# Patient Record
Sex: Female | Born: 1960 | Race: White | Hispanic: No | Marital: Married | Smoking: Never smoker
Health system: Southern US, Community
[De-identification: ages and names within clinical notes are randomized; demographics above are authoritative.]

## PROBLEM LIST (undated history)

## (undated) DIAGNOSIS — K219 Gastro-esophageal reflux disease without esophagitis: Secondary | ICD-10-CM

## (undated) DIAGNOSIS — R51 Headache: Secondary | ICD-10-CM

## (undated) HISTORY — PX: APPENDECTOMY: SHX54

## (undated) HISTORY — PX: HERNIA REPAIR: SHX51

## (undated) HISTORY — PX: CHOLECYSTECTOMY: SHX55

---

## 2001-06-16 ENCOUNTER — Other Ambulatory Visit: Admission: RE | Admit: 2001-06-16 | Discharge: 2001-06-16 | Payer: Self-pay | Admitting: Obstetrics and Gynecology

## 2002-10-04 ENCOUNTER — Encounter: Payer: Self-pay | Admitting: Obstetrics and Gynecology

## 2002-10-04 ENCOUNTER — Ambulatory Visit (HOSPITAL_COMMUNITY): Admission: RE | Admit: 2002-10-04 | Discharge: 2002-10-04 | Payer: Self-pay | Admitting: Obstetrics and Gynecology

## 2002-10-28 ENCOUNTER — Other Ambulatory Visit: Admission: RE | Admit: 2002-10-28 | Discharge: 2002-10-28 | Payer: Self-pay | Admitting: Obstetrics and Gynecology

## 2010-10-16 ENCOUNTER — Inpatient Hospital Stay (HOSPITAL_COMMUNITY)
Admission: RE | Admit: 2010-10-16 | Discharge: 2010-10-17 | Disposition: A | Discharge status: Still In Hospital | Payer: Self-pay | Source: Home / Self Care | Attending: Family Medicine | Admitting: Family Medicine

## 2010-10-17 ENCOUNTER — Encounter (INDEPENDENT_AMBULATORY_CARE_PROVIDER_SITE_OTHER): Payer: Self-pay | Admitting: General Surgery

## 2010-10-20 ENCOUNTER — Encounter: Payer: Self-pay | Admitting: Obstetrics and Gynecology

## 2010-10-21 LAB — DIFFERENTIAL
Basophils Absolute: 0 10*3/uL (ref 0.0–0.1)
Basophils Relative: 0 % (ref 0–1)
Eosinophils Absolute: 0 10*3/uL (ref 0.0–0.7)
Eosinophils Relative: 0 % (ref 0–5)
Lymphocytes Relative: 12 % (ref 12–46)
Lymphs Abs: 3 10*3/uL (ref 0.7–4.0)
Monocytes Absolute: 1.5 10*3/uL — ABNORMAL HIGH (ref 0.1–1.0)
Monocytes Relative: 6 % (ref 3–12)
Neutro Abs: 20.5 10*3/uL — ABNORMAL HIGH (ref 1.7–7.7)
Neutrophils Relative %: 82 % — ABNORMAL HIGH (ref 43–77)

## 2010-10-21 LAB — COMPREHENSIVE METABOLIC PANEL
ALT: 14 U/L (ref 0–35)
AST: 14 U/L (ref 0–37)
Albumin: 3.4 g/dL — ABNORMAL LOW (ref 3.5–5.2)
Alkaline Phosphatase: 49 U/L (ref 39–117)
BUN: 10 mg/dL (ref 6–23)
CO2: 22 mEq/L (ref 19–32)
Calcium: 8.9 mg/dL (ref 8.4–10.5)
Chloride: 103 mEq/L (ref 96–112)
Creatinine, Ser: 0.8 mg/dL (ref 0.4–1.2)
GFR calc Af Amer: >60 mL/min (ref >60)
GFR calc non Af Amer: >60 mL/min (ref >60)
Glucose, Bld: 103 mg/dL — ABNORMAL HIGH (ref 70–99)
Potassium: 3.8 mEq/L (ref 3.5–5.1)
Sodium: 136 mEq/L (ref 135–145)
Total Bilirubin: 0.9 mg/dL (ref 0.3–1.2)
Total Protein: 7 g/dL (ref 6.0–8.3)

## 2010-10-21 LAB — CBC
HCT: 36.8 % (ref 36.0–46.0)
Hemoglobin: 12.9 g/dL (ref 12.0–15.0)
MCH: 30.4 pg (ref 26.0–34.0)
MCHC: 35.1 g/dL (ref 30.0–36.0)
MCV: 86.6 fL (ref 78.0–100.0)
Platelets: 282 10*3/uL (ref 150–400)
RBC: 4.25 MIL/uL (ref 3.87–5.11)
RDW: 12.6 % (ref 11.5–15.5)
WBC: 25 10*3/uL — ABNORMAL HIGH (ref 4.0–10.5)

## 2010-10-21 LAB — URINALYSIS, ROUTINE W REFLEX MICROSCOPIC
Bilirubin Urine: NEGATIVE
Nitrite: NEGATIVE
Specific Gravity, Urine: <1.005 — ABNORMAL LOW (ref 1.005–1.030)
Urine Glucose, Fasting: NEGATIVE mg/dL
pH: 5 (ref 5.0–8.0)

## 2010-10-21 LAB — URINE MICROSCOPIC-ADD ON

## 2010-10-21 LAB — LIPASE, BLOOD: Lipase: 23 U/L (ref 11–59)

## 2010-10-24 NOTE — Op Note (Addendum)
Marisa Mitchell, Marisa Mitchell             ACCOUNT NO.:  [id number]  MEDICAL RECORD NO.:  [id number]          PATIENT TYPE:  INP  LOCATION:  A308                          FACILITY:  [facility]  PHYSICIAN:  Tilford Pillar, MD      DATE OF BIRTH:  Sep 10, 1961  DATE OF PROCEDURE:  10/17/2010 DATE OF DISCHARGE:  10/16/2010                              OPERATIVE REPORT   PREOPERATIVE DIAGNOSIS:  Acute appendicitis.  POSTOPERATIVE DIAGNOSIS:  Acute appendicitis.  PROCEDURE:  Laparoscopic appendectomy.  SURGEON:  Tilford Pillar, MD  ANESTHESIA:  General endotracheal, local anesthetic 0.5% Sensorcaine plain.  SPECIMEN:  Appendix.  ESTIMATED BLOOD LOSS:  Minimal.  INDICATIONS:  The patient is a 50 year old female who presented to [facility] after being evaluated by her primary care physician with suspicion of acute appendicitis.  CT evaluation of the abdomen and pelvis was consistent with the findings and surgical consultation was obtained.  Risks, benefits and alternatives of laparoscopic possible open appendectomy were discussed with the patient including, but not limited to the risk of bleeding, infection bile leak, small bowel injury, common bile duct injury, as well as possibility of intraoperative cardiac or pulmonary events.  The patient's questions and concerns were addressed, and the patient was consented for a planned procedure.  OPERATION:  The patient was taken to the operating room, was placed in the supine position on the operating table, at which time the general anesthetic was administered.  Once the patient was asleep, she was endotracheally intubated by Anesthesia.  At this point, her abdomen was prepped in DuraPrep solution and draped in standard fashion.  After a Foley catheter placed in standard sterile fashion by the operative staff.  At this time, a stab incision was created supraumbilically with a #11 blade scalpel.  Additional dissection down through  the subcuticular tissue was carried out using a Kocher clamp, which was utilized to grasp the anterior abdominal fascia and removed this anteriorly.  A Veress needle was inserted.  Saline drop test was utilized to confirm intraperitoneal placement and then pneumoperitoneum was initiated.  Once sufficient pneumoperitoneum was obtained, a 11-mm trocar was inserted over the laparoscope allowing visualization of the trocar entering into the peritoneal cavity.  At this time, the inner cannula was removed.  The laparoscope was reinserted.  There was no evidence of any trocar or Veress needle placement injury.  At this time, the remaining trocars were placed with a 5-mm trocar in the suprapubic region and an 11-mm trocar in the left lateral abdominal wall.  The patient was positioned into a Trendelenburg left lateral decubitus position.  The cecum was identified, followed down the appendix which was clearly evident and present hyperemic and distended.  A window was created at the base of the appendix between the appendix and the mesoappendix.  The GIA-45 stapler was placed through the umbilical trocar site and then the trocar was removed.  I divided the base of the appendix.  Second load was required to completely divide the appendix and appendiceal stump.  I used the EnSeal LigaSure device to divide the mesentery and the mesoappendix.  At this point, the appendix was  freed and was placed into an EndoCatch bag and placed on the right lobe of the liver.  At this time inspection of the staple line and leak test did not see any bleeding, excellent staple line and at the time, I turned my attention to closure.  A EndoClose suture passing device was utilized to pass a 2-0 Vicryl suture to both the 11-mm trocar sites.  With these sutures in place, the appendix was treated and submitted to the umbilical trocar site and intact EndoCatch bag.  Once the patient was turned, we adequately removed the  appendix, once it was passed off to the back table and sent as a permanent specimen to pathology.  At this time, pneumoperitoneum was evacuated.  The trocar was removed.  The local anesthetic was instilled.  A 4-0 Monocryl was utilized to approximate the skin edges of all 3 trocar sites.  The skin was washed and dried with moist and dry towel.  Benzoin was applied around the incision.  A 0.5-inch Steri- Strips were placed.  Drapes removed.  The patient was allowed to come out of general anesthetic and was transferred back to regular hospital in stable condition.  At the conclusion of the procedure, all instrument, sponge, and needle counts were correct.  The patient tolerated the procedure extremely well.     Tilford Pillar, MD     BZ/MEDQ  D:  10/17/2010  T:  10/17/2010  Job:  [id number]  Electronically Signed by Tilford Pillar MD on 10/24/2010 04:58:45 PM

## 2010-10-24 NOTE — H&P (Addendum)
Marisa Mitchell, CHATHAM             ACCOUNT NO.:  [id number]  MEDICAL RECORD NO.:  [id number]          PATIENT TYPE:  AMB  LOCATION:  ED                            FACILITY:  [facility]  PHYSICIAN:  Tilford Pillar, MD      DATE OF BIRTH:  07-Jun-1961  DATE OF ADMISSION:  10/16/2010 DATE OF DISCHARGE:  LH                             HISTORY & PHYSICAL   CHIEF COMPLAINT:  Right lower quadrant abdominal pain.  HISTORY OF PRESENT ILLNESS:  The patient is a 50 year old female who presented to [facility] with approximately 24 hours of right lower quadrant abdominal pain.  Actually, the patient was awakened earlier this morning with periumbilical pain which was then localized to the right lower quadrant as the day progressed.  She has had associated nausea but no emesis.  She has had subjective chills and fevers and low grade temperatures.  No change with bowel movements.  No melena, no hematochezia.  She has had anorexia.  She has had anorexia through the day.  She has had no similar symptomatology in the past.  Pain is consistent.  It is constant.  It is exacerbated with movement and palpation of the right lower quadrant.  PAST MEDICAL HISTORY:  None.  PAST SURGICAL HISTORY:  Cesarean section x2.  MEDICATIONS:  Estrogen.  ALLERGIES:  NO KNOWN DRUG ALLERGIES.  SOCIAL HISTORY:  Tobacco, no alcohol.  No recreational drug abuse.  FAMILY HISTORY:  Noncontributory.  REVIEW OF SYSTEMS:  CONSTITUTIONAL:  Unremarkable.  EYES:  Unremarkable. ENT:  Unremarkable.  RESPIRATORY:  Unremarkable.  CARDIOVASCULAR: Unremarkable.  GASTROINTESTINAL:  As per HPI.  GENITOURINARY: Unremarkable.  MUSCULOSKELETAL:  Unremarkable.  SKIN:  Unremarkable. ENDOCRINE:  Unremarkable.  NEURO:  Unremarkable.  PHYSICAL EXAM:  VITAL SIGNS: Temperature 99.1, heart rate 103, respiration 19, blood pressure 128/70. GENERAL:  The patient is not in any acute distress.  She is alert and oriented x3. HEENT: Scalp:  No  deformities or masses.  Eyes: Pupils equal, round, reactive.  Extraocular is intact.  No conjunctival pallor is noted. Oral mucosa is pink.  Normal occlusion. NECK:  Trachea is midline.  No cervical lymphadenopathy. PULMONARY:  Unlabored respirations.  She is clear to auscultation bilaterally. CARDIOVASCULAR:  She is tachycardic with a regular rhythm. ABDOMEN:  Diminished bowel sounds.  Abdomen is soft, obese.  Positive right lower quadrant abdominal pain at McBurney's point.  No Rovsing's sign.  No diffuse peritoneal signs.  She does have a small palpable umbilical hernia which is easily reducible.  No other hernias or masses are apparent. EXTREMITIES:  Warm and dry.  PERTINENT LABORATORY/RADIOGRAPHIC STUDIES:  CBC:  White blood cell count 25.0, hemoglobin 12.9, hematocrit 36.8, bicarb 282.  Basic metabolic panel:  Sodium 136, potassium 3.8, chloride 103, bicarb 22, BUN 10, creatinine 0.80, blood glucose 103.  CT of the abdomen and pelvis demonstrates no evidence of any free air or free fluid.  There is periappendiceal and pericecal fat stranding. There is some dilatation of the appendix, and a fecalith was noted within the base of the appendix.  ASSESSMENT/PLAN:  Acute appendicitis:  At this time, the patient will be  continued on an n.p.o. status.  Will be continued on IV fluid and IV antibiotics.  Risks, benefits, and alternatives of possible open appendectomy were discussed with the patient and the patient's husband, who is present during the time of evaluation and discussion.  Risks, benefits and alternatives including to but not limited to the risk of bleeding, infection, appendiceal stump leak as well as the possibility of intraoperative cardiopulmonary events were discussed.  At this time we will proceed to the operating room for an emergent appendectomy.     Tilford Pillar, MD     BZ/MEDQ  D:  10/16/2010  T:  10/16/2010  Job:  [id number]  Electronically Signed by Tilford Pillar MD on 10/24/2010 04:57:18 PM

## 2010-11-21 NOTE — Discharge Summary (Signed)
  NAMECLOIS, MONTAVON             ACCOUNT NO.:  [id number]  MEDICAL RECORD NO.:  [id number]          PATIENT TYPE:  INP  LOCATION:  A308                          FACILITY:  [facility]  PHYSICIAN:  Tilford Pillar, MD      DATE OF BIRTH:  10-16-1960  DATE OF ADMISSION:  10/16/2010 DATE OF DISCHARGE:  01/19/2012LH                              DISCHARGE SUMMARY   This is a 24-hour discharge.  ADMISSION DIAGNOSIS:  Right lower quadrant abdominal pain.  DISCHARGE DIAGNOSIS:  Acute appendicitis status post laparoscopic appendectomy.  PROCEDURES:  Laparoscopic appendectomy.  DISPOSITION:  Home.  BRIEF HISTORY AND PHYSICAL:  Please see the admission history and physical for complete H and P.  The patient is a 50 year old black female who presented to [facility] with approximately 24 hours of increasing right lower quadrant abdominal pain.  Workup was consistent for acute appendicitis.  She was admitted for planned management intervention.  HOSPITAL COURSE:  The patient was admitted.  She was taken to the operating room on the same day.  She underwent a laparoscopic appendectomy, tolerated this extremely well.  Spent a brief period in the post-anesthetic care unit and was transferred to the floor.  Due to the timing of this, she was watched overnight.  She was advanced back on her diet the following morning, she tolerated this extremely well and she was ambulatory.  The pain was controlled and she was tolerating a regular diet.  Plans were made for discharge to home.  She was discharged to home.  DISCHARGE INSTRUCTIONS:  She is to increase activities slowly.  She is not to lift anything greater than 20 pounds for the next 4 weeks.  She may shower, but is not to soak the incisions for the next 2-3 weeks. She was instructed to resume a normal diet.  She is to return to see me in the office in 3 weeks.  She is to call should she have any questions or concerns.  DISCHARGE  MEDICATIONS:  Please see the discharge medication reconciliation sheet for all discharge medications.     Tilford Pillar, MD     BZ/MEDQ  D:  11/20/2010  T:  11/21/2010  Job:  [id number]  Electronically Signed by Tilford Pillar MD on 11/21/2010 10:13:25 AM

## 2011-08-30 ENCOUNTER — Emergency Department (HOSPITAL_COMMUNITY): Payer: Commercial Indemnity

## 2011-08-30 ENCOUNTER — Emergency Department (HOSPITAL_COMMUNITY)
Admission: EM | Admit: 2011-08-30 | Discharge: 2011-08-30 | Disposition: A | Discharge status: Home / Self Care | Payer: Commercial Indemnity | Attending: Emergency Medicine | Admitting: Emergency Medicine

## 2011-08-30 DIAGNOSIS — R112 Nausea with vomiting, unspecified: Secondary | ICD-10-CM | POA: Insufficient documentation

## 2011-08-30 DIAGNOSIS — R51 Headache: Secondary | ICD-10-CM

## 2011-08-30 DIAGNOSIS — G44209 Tension-type headache, unspecified, not intractable: Secondary | ICD-10-CM | POA: Insufficient documentation

## 2011-08-30 HISTORY — DX: Headache: R51

## 2011-08-30 LAB — CBC
MCH: 29.6 pg (ref 26.0–34.0)
Platelets: 296 10*3/uL (ref 150–400)
RBC: 4.66 MIL/uL (ref 3.87–5.11)

## 2011-08-30 LAB — DIFFERENTIAL
Basophils Relative: 0 % (ref 0–1)
Eosinophils Absolute: 0 10*3/uL (ref 0.0–0.7)
Lymphs Abs: 1.2 10*3/uL (ref 0.7–4.0)
Neutro Abs: 14.4 10*3/uL — ABNORMAL HIGH (ref 1.7–7.7)
Neutrophils Relative %: 89 % — ABNORMAL HIGH (ref 43–77)

## 2011-08-30 LAB — COMPREHENSIVE METABOLIC PANEL
ALT: 10 U/L (ref 0–35)
Albumin: 3.7 g/dL (ref 3.5–5.2)
Alkaline Phosphatase: 74 U/L (ref 39–117)
Glucose, Bld: 116 mg/dL — ABNORMAL HIGH (ref 70–99)
Potassium: 3.8 mEq/L (ref 3.5–5.1)
Sodium: 139 mEq/L (ref 135–145)
Total Protein: 7.7 g/dL (ref 6.0–8.3)

## 2011-08-30 MED ORDER — METOCLOPRAMIDE HCL 5 MG/ML IJ SOLN
10.0000 mg | Freq: Once | INTRAMUSCULAR | Status: AC
  Administered 2011-08-30: 10 mg via INTRAVENOUS
  Filled 2011-08-30: qty 2

## 2011-08-30 MED ORDER — KETOROLAC TROMETHAMINE 30 MG/ML IJ SOLN
30.0000 mg | Freq: Once | INTRAMUSCULAR | Status: AC
  Administered 2011-08-30: 30 mg via INTRAVENOUS
  Filled 2011-08-30: qty 1

## 2011-08-30 MED ORDER — HYDROCODONE-ACETAMINOPHEN 5-325 MG PO TABS: 1.0000 | ORAL_TABLET | Freq: Four times a day (QID) | ORAL | Status: AC | PRN

## 2011-08-30 NOTE — ED Provider Notes (Signed)
History     CSN: [id number] Arrival date & time: 08/30/2011  3:12 PM   First MD Initiated Contact with Patient 08/30/11 1535      Chief Complaint  Patient presents with  . Headache  . Emesis    (Consider location/radiation/quality/duration/timing/severity/associated sxs/prior treatment) Patient is a 50 y.o. female presenting with headaches and vomiting. The history is provided by the patient (The patient complains of a headache with nausea. She has had this for a few days. Patient states that she has frequent headaches. This was worse than her normal head). No language interpreter was used.  Headache  This is a recurrent problem. The current episode started 2 days ago. The problem occurs constantly. The problem has not changed since onset.The headache is associated with nothing. The pain is located in the bilateral region. The quality of the pain is described as dull. The pain is at a severity of 7/10. Associated symptoms include nausea and vomiting. Pertinent negatives include no anorexia and no fever. She has tried nothing for the symptoms.  Emesis  Associated symptoms include headaches. Pertinent negatives include no abdominal pain, no cough, no diarrhea and no fever.    Past Medical History  Diagnosis Date  . Headache     Past Surgical History  Procedure Date  . Cesarean section   . Appendectomy     No family history on file.  History  Substance Use Topics  . Smoking status: Never Smoker   . Smokeless tobacco: Not on file  . Alcohol Use: No    OB History    Grav Para Term Preterm Abortions TAB SAB Ect Mult Living                  Review of Systems  Constitutional: Negative for fever and fatigue.  HENT: Negative for congestion, sinus pressure and ear discharge.   Eyes: Negative for discharge.  Respiratory: Negative for cough.   Cardiovascular: Negative for chest pain.  Gastrointestinal: Positive for nausea and vomiting. Negative for abdominal pain, diarrhea  and anorexia.  Genitourinary: Negative for frequency and hematuria.  Musculoskeletal: Negative for back pain.  Skin: Negative for rash.  Neurological: Positive for headaches. Negative for seizures.  Hematological: Negative.   Psychiatric/Behavioral: Negative for hallucinations.    Allergies  Codeine  Home Medications   Current Outpatient Rx  Name Route Sig Dispense Refill  . NORETHINDRONE ACET-ETHINYL EST 1.5-30 MG-MCG PO TABS Oral Take 1 tablet by mouth daily.      Marland Kitchen HYDROCODONE-ACETAMINOPHEN 5-325 MG PO TABS Oral Take 1 tablet by mouth every 6 (six) hours as needed for pain. 20 tablet 0    BP 141/82  Pulse 92  Temp(Src) 98.2 F (36.8 C) (Oral)  Resp 20  Ht 5\' 7"  (1.702 m)  Wt 190 lb (86.183 kg)  BMI 29.76 kg/m2  SpO2 99%  Physical Exam  Constitutional: She is oriented to person, place, and time. She appears well-developed.  HENT:  Head: Normocephalic and atraumatic.  Eyes: Conjunctivae and EOM are normal. No scleral icterus.  Neck: Neck supple. No thyromegaly present.  Cardiovascular: Normal rate and regular rhythm.  Exam reveals no gallop and no friction rub.   No murmur heard. Pulmonary/Chest: No stridor. She has no wheezes. She has no rales. She exhibits no tenderness.  Abdominal: She exhibits no distension. There is no tenderness. There is no rebound.  Musculoskeletal: Normal range of motion. She exhibits no edema.  Lymphadenopathy:    She has no cervical adenopathy.  Neurological: She  is oriented to person, place, and time. Coordination normal.  Skin: No rash noted. No erythema.  Psychiatric: She has a normal mood and affect. Her behavior is normal.    ED Course  Procedures (including critical care time)  Labs Reviewed  CBC - Abnormal; Notable for the following:    WBC 16.2 (*)    All other components within normal limits  DIFFERENTIAL - Abnormal; Notable for the following:    Neutrophils Relative 89 (*)    Neutro Abs 14.4 (*)    Lymphocytes Relative 7  (*)    All other components within normal limits  COMPREHENSIVE METABOLIC PANEL - Abnormal; Notable for the following:    Glucose, Bld 116 (*)    All other components within normal limits   Ct Head Wo Contrast  08/30/2011  *RADIOLOGY REPORT*  Clinical Data: Headache and vomiting  CT HEAD WITHOUT CONTRAST  Technique:  Contiguous axial images were obtained from the base of the skull through the vertex without contrast.  Comparison: None.  Findings: The ventricles are normal in size, shape, and position. There is no mass effect or midline shift.  No acute hemorrhage or abnormal extra-axial fluid collections are identified.  The gray/white differentiation is normal.  The orbits, calvarium, visualized paranasal sinuses have a normal appearance.  IMPRESSION: Normal CT scan of the head with no evidence of acute intracranial abnormality.  Original Report Authenticated By: Brandon Melnick, M.D.     1. Headache     Pt improved with tx  MDM  Stress headache        Benny Lennert, MD 08/30/11 [phone]

## 2011-08-30 NOTE — ED Notes (Signed)
Pt presents with headache and vomiting. Pt states she vomited once this afternoon and instantly got a headache. Pt states this is worse than her normal "monthly" headaches. Pt states pain comes in waves.

## 2012-02-10 ENCOUNTER — Other Ambulatory Visit: Payer: Self-pay | Admitting: Obstetrics and Gynecology

## 2012-05-25 ENCOUNTER — Other Ambulatory Visit (HOSPITAL_COMMUNITY): Payer: Self-pay | Admitting: Obstetrics and Gynecology

## 2012-05-25 ENCOUNTER — Encounter (HOSPITAL_COMMUNITY): Payer: Self-pay | Admitting: Pharmacist

## 2012-05-25 ENCOUNTER — Other Ambulatory Visit: Payer: Self-pay | Admitting: Obstetrics and Gynecology

## 2012-05-25 ENCOUNTER — Ambulatory Visit (HOSPITAL_COMMUNITY): Payer: Commercial Indemnity

## 2012-05-25 ENCOUNTER — Ambulatory Visit
Admission: RE | Admit: 2012-05-25 | Discharge: 2012-05-25 | Disposition: A | Discharge status: Home / Self Care | Payer: Commercial Indemnity | Source: Ambulatory Visit | Attending: Obstetrics and Gynecology | Admitting: Obstetrics and Gynecology

## 2012-05-25 ENCOUNTER — Encounter (HOSPITAL_COMMUNITY): Payer: Self-pay | Admitting: *Deleted

## 2012-05-25 DIAGNOSIS — R109 Unspecified abdominal pain: Secondary | ICD-10-CM

## 2012-05-25 DIAGNOSIS — R140 Abdominal distension (gaseous): Secondary | ICD-10-CM

## 2012-05-25 MED ORDER — IOHEXOL 300 MG/ML  SOLN
100.0000 mL | Freq: Once | INTRAMUSCULAR | Status: AC | PRN
  Administered 2012-05-25: 100 mL via INTRAVENOUS

## 2012-05-26 ENCOUNTER — Encounter (INDEPENDENT_AMBULATORY_CARE_PROVIDER_SITE_OTHER): Payer: Self-pay | Admitting: *Deleted

## 2012-05-27 ENCOUNTER — Ambulatory Visit (INDEPENDENT_AMBULATORY_CARE_PROVIDER_SITE_OTHER): Payer: Commercial Indemnity | Admitting: Internal Medicine

## 2012-05-27 ENCOUNTER — Encounter (INDEPENDENT_AMBULATORY_CARE_PROVIDER_SITE_OTHER): Payer: Self-pay | Admitting: Internal Medicine

## 2012-05-27 VITALS — BP 132/84 | HR 72 | Temp 98.2°F | Ht 66.0 in | Wt 204.2 lb

## 2012-05-27 DIAGNOSIS — K469 Unspecified abdominal hernia without obstruction or gangrene: Secondary | ICD-10-CM | POA: Insufficient documentation

## 2012-05-27 DIAGNOSIS — R51 Headache: Secondary | ICD-10-CM

## 2012-05-27 DIAGNOSIS — R519 Headache, unspecified: Secondary | ICD-10-CM | POA: Insufficient documentation

## 2012-05-27 DIAGNOSIS — K219 Gastro-esophageal reflux disease without esophagitis: Secondary | ICD-10-CM | POA: Insufficient documentation

## 2012-05-27 NOTE — Patient Instructions (Addendum)
Call when you are ready for colonoscopy after your surgery.  Your surgeon will need to release you. Zegrid samples given to patient x 3 bottle (15 pills)

## 2012-05-27 NOTE — Progress Notes (Signed)
Subjective:     Patient ID: Marisa Mitchell, female   DOB: 02/22/1961, 51 y.o.   MRN: [id number]  HPI Referred to our office by [facility] for a colonoscopy.  She does tells me she leaned up against a wall and felt a lump in her side.  She underwent a CT scan  8/27/2013IMPRESSION:  1. New left-sided spigelian hernia containing omental fat.  2. Stable small periumbilical hernia.  3. No significant intra abdominal/pelvic findings. She is seeing a Careers adviser in [place] for her hernia at [facility].  Appetite is good. No weight loss. Bm x 1 a day. No melena or bright red rectal bleeding.  She does c/o frequent acid reflux since her CT scan.  She has never undergone a colonoscopy.    Review of Systems see hpi Current Outpatient Prescriptions  Medication Sig Dispense Refill  . cetirizine (ZYRTEC) 10 MG tablet Take 10 mg by mouth daily as needed. For allergies      . Norethindrone Acetate-Ethinyl Estradiol (LOESTRIN 1.5/30, 21,) 1.5-30 MG-MCG tablet Take 1 tablet by mouth daily.         Past Medical History  Diagnosis Date  . Headache   . GERD (gastroesophageal reflux disease)    Past Surgical History  Procedure Date  . Cesarean section     x2  . Appendectomy    History   Social History  . Marital Status: Married    Spouse Name: N/A    Number of Children: N/A  . Years of Education: N/A   Occupational History  . Not on file.   Social History Main Topics  . Smoking status: Never Smoker   . Smokeless tobacco: Not on file  . Alcohol Use: No  . Drug Use: No  . Sexually Active: Yes    Birth Control/ Protection: None   Other Topics Concern  . Not on file   Social History Narrative  . No narrative on file   Family Status  Relation Status Death Age  . Mother Alive     good health  . Father Alive     good health  . Sister Alive     good health  . Brother Alive     good health   Allergies  Allergen Reactions  . Codeine Other (See Comments)   Stomach Cramps        Objective:   Physical Exam  Filed Vitals:   05/27/12 1511  BP: 132/84  Pulse: 72  Temp: 98.2 F (36.8 C)  Alert and oriented. Skin warm and dry. Oral mucosa is moist.   . Sclera anicteric, conjunctivae is pink. Thyroid not enlarged. No cervical lymphadenopathy. Lungs clear. Heart regular rate and rhythm.  Abdomen is soft. Bowel sounds are positive. No hepatomegaly. No abdominal masses felt. No tenderness.  No edema to lower extremities.        Assessment:    Patient for screening colonoscopy.  Acid reflux not controlled at this time.     Plan:    Screening colonoscopy. Keep appt with surgeon.   Zegrid samples x 3 boxes given to patient.

## 2012-05-28 ENCOUNTER — Ambulatory Visit (INDEPENDENT_AMBULATORY_CARE_PROVIDER_SITE_OTHER): Payer: Commercial Indemnity | Admitting: Surgery

## 2012-05-28 ENCOUNTER — Encounter (INDEPENDENT_AMBULATORY_CARE_PROVIDER_SITE_OTHER): Payer: Self-pay | Admitting: Surgery

## 2012-05-28 VITALS — BP 136/78 | HR 62 | Temp 99.8°F | Resp 18 | Ht 66.0 in | Wt 204.4 lb

## 2012-05-28 DIAGNOSIS — K469 Unspecified abdominal hernia without obstruction or gangrene: Secondary | ICD-10-CM

## 2012-05-28 NOTE — Progress Notes (Signed)
Re:   Marisa Mitchell DOB:   June 18, 1961 MRN:   [id number]  ASSESSMENT AND PLAN: 1.  Hernia, spigelian, left sided  I discussed the indications and complications of hernia surgery with the patient.  I discussed both the laparoscopic and open approach to hernia repair..  The potential risks of hernia surgery include, but are not limited to, bleeding, infection, open surgery, nerve injury, and recurrence of the hernia.  I provided the patient literature about hernia surgery.  She is going to get her D&C and her colonoscopy first, then she will call us back to set up the spigelian hernia surgery.  I will also try to fix her umbilical hernia at the same time.  2.  Periumbilical hernia  3.  Gallstones - asymptomatic  I gave her literature on gall bladder disease. 4.  Recent UTI - cleared 5.  Atypical pap  For D&C next week - Dr. Luna Kitchens 6.  She is scheduled for a colonoscopy with Dr. Karilyn Cota - date to be determined  Chief Complaint  Patient presents with  . Pre-op Exam   REFERRING PHYSICIAN: Colette Ribas, MD  HISTORY OF PRESENT ILLNESS: Marisa Mitchell is a 51 y.o. (DOB: Sep 02, 1961)  white female whose primary care physician is Colette Ribas, MD and comes to me today for abdominal wall hernia.  She noticed some pain about 2-3 weeks ago. She saw her gynecologist, Dr. Esperanza Richters. They did an ultrasound of her pelvis which was negative. She then saw her medical doctor (or his PA) at [facility] in [place]. They thought she had a urinary tract infection, started her on Cipro to switch her to Macrobid.   At this time she felt a mass in her left abdomen. She saw Dr. Esperanza Richters again who obtained a CT scan on 05/25/2012 that showed: #1 spigelian hernia, #2 periumbilical hernia, #3 gallstones.  She has known about the gallstones since an appendectomy last year, but these have been asymptomatic.  She is here to discuss the hernia.   Past Medical History  Diagnosis Date    . Headache   . GERD (gastroesophageal reflux disease)      Past Surgical History  Procedure Date  . Cesarean section     x2  . Appendectomy      Current Outpatient Prescriptions  Medication Sig Dispense Refill  . cetirizine (ZYRTEC) 10 MG tablet Take 10 mg by mouth daily as needed. For allergies      . Norethindrone Acetate-Ethinyl Estradiol (LOESTRIN 1.5/30, 21,) 1.5-30 MG-MCG tablet Take 1 tablet by mouth daily.            Allergies  Allergen Reactions  . Codeine Other (See Comments)    Stomach Cramps    REVIEW OF SYSTEMS: Skin:  No history of rash.  No history of abnormal moles. Infection:  No history of hepatitis or HIV.  No history of MRSA. Neurologic:  No history of stroke.  No history of seizure.  No history of headaches. Cardiac:  No history of hypertension. No history of heart disease.  No history of prior cardiac catheterization.  No history of seeing a cardiologist. Pulmonary:  Does not smoke cigarettes.  No asthma or bronchitis.  No OSA/CPAP.  Endocrine:  No diabetes. No thyroid disease. Gastrointestinal:  No history of stomach disease.  No history of liver disease.  No history of gall bladder disease.  No history of pancreas disease.  For colonscopy with Dr. Karilyn Cota Urologic:  No history of kidney stones.  No  history of bladder infections. Musculoskeletal:  No history of joint or back disease. Hematologic:  No bleeding disorder.  No history of anemia.  Not anticoagulated. Psycho-social:  The patient is oriented.   The patient has no obvious psychologic or social impairment to understanding our conversation and plan.  SOCIAL and FAMILY HISTORY: Works at [employer] in [place] She is married.  PHYSICAL EXAM: BP 136/78  Pulse 62  Temp 99.8 F (37.7 C) (Oral)  Resp 18  Ht 5\' 6"  (1.676 m)  Wt 204 lb 6.4 oz (92.715 kg)  BMI 32.99 kg/m2  General: WN WF who is alert and generally healthy appearing.  HEENT: Normal. Pupils equal. Neck: Supple. No mass.   No thyroid mass. Lymph Nodes:  No supraclavicular or cervical nodes. Lungs: Clear to auscultation and symmetric breath sounds. Heart:  RRR. No murmur or rub.  Abdomen: Soft. No mass. No tenderness.  Pfannensteil incision.  Small umbilical hernia.  On standing, I can feel a hernia in the LLQ c/w a Spigelian hernia.  Had lap appendectomy by Dr. Leticia Penna in 2012. Rectal: Not done. Extremities:  Good strength and ROM  in upper and lower extremities. Neurologic:  Grossly intact to motor and sensory function. Psychiatric: Has normal mood and affect. Behavior is normal.   DATA REVIEWED: CT scan and clinic notes.  Ovidio Kin, MD,  [facility] [address]  [address]   [place], [place]    [address] Phone:  [phone] FAX:  [phone]

## 2012-06-02 ENCOUNTER — Telehealth (INDEPENDENT_AMBULATORY_CARE_PROVIDER_SITE_OTHER): Payer: Self-pay | Admitting: Internal Medicine

## 2012-06-02 ENCOUNTER — Telehealth (INDEPENDENT_AMBULATORY_CARE_PROVIDER_SITE_OTHER): Payer: Self-pay | Admitting: *Deleted

## 2012-06-02 ENCOUNTER — Other Ambulatory Visit (INDEPENDENT_AMBULATORY_CARE_PROVIDER_SITE_OTHER): Payer: Self-pay | Admitting: *Deleted

## 2012-06-02 DIAGNOSIS — Z1211 Encounter for screening for malignant neoplasm of colon: Secondary | ICD-10-CM

## 2012-06-02 MED ORDER — PEG-KCL-NACL-NASULF-NA ASC-C 100 G PO SOLR: 1.0000 | Freq: Once | ORAL | Status: DC

## 2012-06-02 NOTE — Telephone Encounter (Signed)
Patient needs movi prep 

## 2012-06-02 NOTE — Telephone Encounter (Signed)
TCS sch'd 06/24/12 @ 1:20, patient aware

## 2012-06-02 NOTE — Telephone Encounter (Signed)
Ann, please schedule a screening colonoscopy.  Her surgeon wants her to have the colonoscopy before he does her hernia surgery.    Thanks. I have spoke with [facility]

## 2012-06-03 ENCOUNTER — Encounter (HOSPITAL_COMMUNITY): Payer: Self-pay | Admitting: *Deleted

## 2012-06-03 ENCOUNTER — Encounter (HOSPITAL_COMMUNITY): Payer: Self-pay | Admitting: Anesthesiology

## 2012-06-03 ENCOUNTER — Ambulatory Visit (HOSPITAL_COMMUNITY): Payer: Managed Care, Other (non HMO) | Admitting: Anesthesiology

## 2012-06-03 ENCOUNTER — Encounter (HOSPITAL_COMMUNITY)
Admission: RE | Disposition: A | Discharge status: Home / Self Care | Payer: Self-pay | Source: Ambulatory Visit | Attending: Obstetrics and Gynecology

## 2012-06-03 ENCOUNTER — Ambulatory Visit (HOSPITAL_COMMUNITY)
Admission: RE | Admit: 2012-06-03 | Discharge: 2012-06-03 | Disposition: A | Discharge status: Home / Self Care | Payer: Managed Care, Other (non HMO) | Source: Ambulatory Visit | Attending: Obstetrics and Gynecology | Admitting: Obstetrics and Gynecology

## 2012-06-03 DIAGNOSIS — N870 Mild cervical dysplasia: Secondary | ICD-10-CM | POA: Insufficient documentation

## 2012-06-03 HISTORY — DX: Gastro-esophageal reflux disease without esophagitis: K21.9

## 2012-06-03 HISTORY — PX: HYSTEROSCOPY WITH D & C: SHX1775

## 2012-06-03 HISTORY — PX: CERVICAL CONIZATION W/BX: SHX1330

## 2012-06-03 LAB — CBC
HCT: 40.6 % (ref 36.0–46.0)
MCV: 89.2 fL (ref 78.0–100.0)
Platelets: 290 10*3/uL (ref 150–400)
RBC: 4.55 MIL/uL (ref 3.87–5.11)
WBC: 12.2 10*3/uL — ABNORMAL HIGH (ref 4.0–10.5)

## 2012-06-03 SURGERY — CONE BIOPSY, CERVIX
Anesthesia: General | Site: Uterus | Wound class: Clean Contaminated

## 2012-06-03 MED ORDER — FENTANYL CITRATE 0.05 MG/ML IJ SOLN
INTRAMUSCULAR | Status: AC
  Filled 2012-06-03: qty 2

## 2012-06-03 MED ORDER — MIDAZOLAM HCL 5 MG/5ML IJ SOLN
INTRAMUSCULAR | Status: DC | PRN
  Administered 2012-06-03: 2 mg via INTRAVENOUS

## 2012-06-03 MED ORDER — ONDANSETRON HCL 4 MG/2ML IJ SOLN
INTRAMUSCULAR | Status: DC | PRN
  Administered 2012-06-03: 4 mg via INTRAVENOUS

## 2012-06-03 MED ORDER — IBUPROFEN 200 MG PO TABS: 600.0000 mg | ORAL_TABLET | Freq: Four times a day (QID) | ORAL | Status: DC | PRN

## 2012-06-03 MED ORDER — KETOROLAC TROMETHAMINE 60 MG/2ML IM SOLN
INTRAMUSCULAR | Status: AC
  Filled 2012-06-03: qty 2

## 2012-06-03 MED ORDER — FENTANYL CITRATE 0.05 MG/ML IJ SOLN
INTRAMUSCULAR | Status: DC | PRN
  Administered 2012-06-03: 100 ug via INTRAVENOUS

## 2012-06-03 MED ORDER — FENTANYL CITRATE 0.05 MG/ML IJ SOLN
INTRAMUSCULAR | Status: AC
  Administered 2012-06-03: 50 ug via INTRAVENOUS
  Filled 2012-06-03: qty 2

## 2012-06-03 MED ORDER — LIDOCAINE-EPINEPHRINE (PF) 1 %-1:200000 IJ SOLN
INTRAMUSCULAR | Status: DC | PRN
  Administered 2012-06-03: 10 mL

## 2012-06-03 MED ORDER — PROMETHAZINE HCL 25 MG/ML IJ SOLN: 6.2500 mg | INTRAMUSCULAR | Status: DC | PRN

## 2012-06-03 MED ORDER — MEPERIDINE HCL 25 MG/ML IJ SOLN: 6.2500 mg | INTRAMUSCULAR | Status: DC | PRN

## 2012-06-03 MED ORDER — LACTATED RINGERS IV SOLN
INTRAVENOUS | Status: DC
  Administered 2012-06-03 (×2): via INTRAVENOUS

## 2012-06-03 MED ORDER — MIDAZOLAM HCL 2 MG/2ML IJ SOLN
INTRAMUSCULAR | Status: AC
  Filled 2012-06-03: qty 2

## 2012-06-03 MED ORDER — LIDOCAINE-EPINEPHRINE (PF) 1 %-1:200000 IJ SOLN
INTRAMUSCULAR | Status: AC
  Filled 2012-06-03: qty 10

## 2012-06-03 MED ORDER — GLYCOPYRROLATE 0.2 MG/ML IJ SOLN
INTRAMUSCULAR | Status: AC
  Filled 2012-06-03: qty 1

## 2012-06-03 MED ORDER — KETOROLAC TROMETHAMINE 30 MG/ML IJ SOLN
INTRAMUSCULAR | Status: DC | PRN
  Administered 2012-06-03: 30 mg via INTRAVENOUS

## 2012-06-03 MED ORDER — LIDOCAINE HCL (CARDIAC) 20 MG/ML IV SOLN
INTRAVENOUS | Status: AC
  Filled 2012-06-03: qty 5

## 2012-06-03 MED ORDER — ONDANSETRON HCL 4 MG/2ML IJ SOLN
INTRAMUSCULAR | Status: AC
  Filled 2012-06-03: qty 2

## 2012-06-03 MED ORDER — HYDROMORPHONE HCL 2 MG PO TABS: ORAL_TABLET | ORAL | Status: DC

## 2012-06-03 MED ORDER — LIDOCAINE HCL 1 % IJ SOLN
INTRAMUSCULAR | Status: DC | PRN
  Administered 2012-06-03: 20 mL

## 2012-06-03 MED ORDER — PROPOFOL 10 MG/ML IV EMUL
INTRAVENOUS | Status: AC
  Filled 2012-06-03: qty 20

## 2012-06-03 MED ORDER — FENTANYL CITRATE 0.05 MG/ML IJ SOLN
25.0000 ug | INTRAMUSCULAR | Status: DC | PRN
  Administered 2012-06-03: 50 ug via INTRAVENOUS

## 2012-06-03 MED ORDER — LIDOCAINE HCL (CARDIAC) 20 MG/ML IV SOLN
INTRAVENOUS | Status: DC | PRN
  Administered 2012-06-03: 50 mg via INTRAVENOUS

## 2012-06-03 MED ORDER — GLYCINE 1.5 % IR SOLN
Status: DC | PRN
  Administered 2012-06-03: 3000 mL

## 2012-06-03 MED ORDER — MIDAZOLAM HCL 2 MG/2ML IJ SOLN: 0.5000 mg | Freq: Once | INTRAMUSCULAR | Status: DC | PRN

## 2012-06-03 MED ORDER — GLYCOPYRROLATE 0.2 MG/ML IJ SOLN
INTRAMUSCULAR | Status: DC | PRN
  Administered 2012-06-03: 0.2 mg via INTRAVENOUS

## 2012-06-03 MED ORDER — PROPOFOL 10 MG/ML IV EMUL
INTRAVENOUS | Status: DC | PRN
  Administered 2012-06-03: 200 mg via INTRAVENOUS

## 2012-06-03 MED ORDER — KETOROLAC TROMETHAMINE 30 MG/ML IJ SOLN: 15.0000 mg | Freq: Once | INTRAMUSCULAR | Status: DC | PRN

## 2012-06-03 SURGICAL SUPPLY — 41 items
ABLATOR ENDOMETRIAL BIPOLAR (ABLATOR) IMPLANT
APPLICATOR COTTON TIP 6IN STRL (MISCELLANEOUS) ×3 IMPLANT
BLADE SURG 11 STRL SS (BLADE) ×3 IMPLANT
CANISTER SUCTION 2500CC (MISCELLANEOUS) ×3 IMPLANT
CATH ROBINSON RED A/P 16FR (CATHETERS) ×3 IMPLANT
CATH THERMACHOICE III (CATHETERS) IMPLANT
CLOTH BEACON ORANGE TIMEOUT ST (SAFETY) ×3 IMPLANT
CONTAINER PREFILL 10% NBF 60ML (FORM) ×9 IMPLANT
COUNTER NEEDLE 1200 MAGNETIC (NEEDLE) ×3 IMPLANT
DECANTER SPIKE VIAL GLASS SM (MISCELLANEOUS) ×9 IMPLANT
DRESSING TELFA 8X3 (GAUZE/BANDAGES/DRESSINGS) ×6 IMPLANT
ELECT LLETZ BALL 5MM DISP (ELECTRODE) ×3 IMPLANT
ELECT REM PT RETURN 9FT ADLT (ELECTROSURGICAL) ×3
ELECTRODE REM PT RTRN 9FT ADLT (ELECTROSURGICAL) ×2 IMPLANT
GAUZE SPONGE 4X4 16PLY XRAY LF (GAUZE/BANDAGES/DRESSINGS) ×3 IMPLANT
GLOVE BIO SURGEON STRL SZ 6.5 (GLOVE) ×3 IMPLANT
GLOVE BIOGEL PI IND STRL 7.0 (GLOVE) ×10 IMPLANT
GLOVE BIOGEL PI INDICATOR 7.0 (GLOVE) ×5
GLOVE SURG SS PI 7.0 STRL IVOR (GLOVE) ×9 IMPLANT
GOWN PREVENTION PLUS LG XLONG (DISPOSABLE) IMPLANT
GOWN STRL REIN XL XLG (GOWN DISPOSABLE) ×6 IMPLANT
LOOP ANGLED CUTTING 22FR (CUTTING LOOP) IMPLANT
NEEDLE SPNL 22GX3.5 QUINCKE BK (NEEDLE) ×3 IMPLANT
NS IRRIG 1000ML POUR BTL (IV SOLUTION) ×3 IMPLANT
PACK HYSTEROSCOPY LF (CUSTOM PROCEDURE TRAY) ×3 IMPLANT
PACK VAGINAL MINOR WOMEN LF (CUSTOM PROCEDURE TRAY) IMPLANT
PAD OB MATERNITY 4.3X12.25 (PERSONAL CARE ITEMS) ×3 IMPLANT
PENCIL BUTTON HOLSTER BLD 10FT (ELECTRODE) ×3 IMPLANT
SCOPETTES 8  STERILE (MISCELLANEOUS) ×2
SCOPETTES 8 STERILE (MISCELLANEOUS) ×4 IMPLANT
SPONGE SURGIFOAM ABS GEL 12-7 (HEMOSTASIS) ×3 IMPLANT
SUT VIC AB 0 CT1 27 (SUTURE) ×2
SUT VIC AB 0 CT1 27XBRD ANBCTR (SUTURE) ×4 IMPLANT
SUT VIC AB 2-0 CT1 (SUTURE) IMPLANT
SUT VIC AB 2-0 SH 27 (SUTURE) ×1
SUT VIC AB 2-0 SH 27XBRD (SUTURE) ×2 IMPLANT
SYR CONTROL 10ML LL (SYRINGE) ×3 IMPLANT
TOWEL OR 17X24 6PK STRL BLUE (TOWEL DISPOSABLE) ×6 IMPLANT
TUBING NON-CON 1/4 X 20 CONN (TUBING) IMPLANT
WATER STERILE IRR 1000ML POUR (IV SOLUTION) ×3 IMPLANT
YANKAUER SUCT BULB TIP NO VENT (SUCTIONS) ×3 IMPLANT

## 2012-06-03 NOTE — Anesthesia Postprocedure Evaluation (Signed)
Anesthesia Post Note  Patient: Marisa Mitchell  Procedure(s) Performed: Procedure(s) (LRB): CONIZATION CERVIX WITH BIOPSY (N/A) DILATATION AND CURETTAGE /HYSTEROSCOPY (N/A)  Anesthesia type: General  Patient location: PACU  Post pain: Pain level controlled  Post assessment: Post-op Vital signs reviewed  Last Vitals:  Filed Vitals:   06/03/12 1138  BP: 139/84  Pulse: 91  Temp: 37.4 C  Resp: 18    Post vital signs: Reviewed  Level of consciousness: sedated  Complications: No apparent anesthesia complicationsfj

## 2012-06-03 NOTE — Progress Notes (Signed)
Plan of care d/w pt.  Questions answered, informed consent.

## 2012-06-03 NOTE — Anesthesia Preprocedure Evaluation (Addendum)
Anesthesia Evaluation  Patient identified by MRN, date of birth, ID band Patient awake    Reviewed: Allergy & Precautions, H&P , Patient's Chart, lab work & pertinent test results, reviewed documented beta blocker date and time   History of Anesthesia Complications Negative for: history of anesthetic complications  Airway Mallampati: II TM Distance: >3 FB Neck ROM: full    Dental No notable dental hx.    Pulmonary neg pulmonary ROS,  breath sounds clear to auscultation  Pulmonary exam normal       Cardiovascular Exercise Tolerance: Good negative cardio ROS  Rhythm:regular Rate:Normal     Neuro/Psych  Headaches, negative neurological ROS  negative psych ROS   GI/Hepatic negative GI ROS, Neg liver ROS, GERD-  Controlled,  Endo/Other  negative endocrine ROS  Renal/GU negative Renal ROS     Musculoskeletal   Abdominal   Peds  Hematology negative hematology ROS (+)   Anesthesia Other Findings   Reproductive/Obstetrics negative OB ROS                           Anesthesia Physical Anesthesia Plan  ASA: II  Anesthesia Plan: General LMA   Post-op Pain Management:    Induction:   Airway Management Planned:   Additional Equipment:   Intra-op Plan:   Post-operative Plan:   Informed Consent: I have reviewed the patients History and Physical, chart, labs and discussed the procedure including the risks, benefits and alternatives for the proposed anesthesia with the patient or authorized representative who has indicated his/her understanding and acceptance.   Dental Advisory Given  Plan Discussed with: CRNA, Surgeon and Anesthesiologist  Anesthesia Plan Comments:        Anesthesia Quick Evaluation

## 2012-06-03 NOTE — Transfer of Care (Signed)
Immediate Anesthesia Transfer of Care Note  Patient: Marisa Mitchell  Procedure(s) Performed: Procedure(s) (LRB) with comments: CONIZATION CERVIX WITH BIOPSY (N/A) DILATATION AND CURETTAGE /HYSTEROSCOPY (N/A)  Patient Location: PACU  Anesthesia Type: General  Level of Consciousness: awake, alert  and oriented  Airway & Oxygen Therapy: Patient Spontanous Breathing and Patient connected to nasal cannula oxygen  Post-op Assessment: Report given to PACU RN and Post -op Vital signs reviewed and stable  Post vital signs: Reviewed and stable  Complications: No apparent anesthesia complications

## 2012-06-03 NOTE — H&P (Signed)
Marisa Mitchell, BUNNELL             ACCOUNT NO.:  [id number]  MEDICAL RECORD NO.:  [id number]  LOCATION:  [facility]                         FACILITY:  [facility]  PHYSICIAN:  Zelphia Cairo, MD    DATE OF BIRTH:  [birth year]  DATE OF ADMISSION:  05/25/2012 DATE OF DISCHARGE:                             HISTORY & PHYSICAL   HISTORY:  A 51 year old female with atypical glandular cell on Pap smear, presents today further workup and evaluation.  At the time of her last colonoscopy, she was noted to have acute and chronic inflammation, but no dysplasia was noted.  Endocervical curettage was negative. Endometrial biopsy was negative.  Given a Pap smear showing atypical glandular cells, not otherwise specified, I recommended hysteroscopy with D and C and cone biopsy.  The patient agreed to move forward.  PAST MEDICAL HISTORY: 1. Reflux. 2. Hernia. 3. Gallstones.  SURGICAL HISTORY:  Cesarean section x2, appendectomy.  CURRENT MEDICATIONS:  Zyrtec and Loestrin.  ALLERGIES:  CODEINE.  FAMILY HISTORY:  Noncontributory.  PHYSICAL EXAMINATION:  VITAL SIGNS:  She is afebrile with stable vital signs. GENERAL:  She is in no acute distress. HEART:  Regular rate and rhythm. LUNGS:  Clear bilaterally. ABDOMEN:  Soft, tender in the left lower quadrant due to hernia, which is currently being evaluated and treated by her general surgeon. EXTREMITIES:  Without edema. PELVIC:  Normal external female genitalia.  Vagina and cervix appear normal.  Uterus is mobile, nontender.  No adnexal masses noted.  Pelvic ultrasound was negative.  ASSESSMENT:  Atypical glandular cells.  PLAN:  Hysteroscopy D and C, cold knife cone.     Zelphia Cairo, MD     GA/MEDQ  D:  06/02/2012  T:  06/03/2012  Job:  [id number]

## 2012-06-04 ENCOUNTER — Encounter (HOSPITAL_COMMUNITY): Payer: Self-pay | Admitting: Obstetrics and Gynecology

## 2012-06-04 NOTE — Op Note (Signed)
[name], [name]             ACCOUNT NO.:  [id number]  MEDICAL RECORD NO.:  [id number]  LOCATION:  [facility]                          [facility]ILITY:  [facility]  PHYSICIAN:  Zelphia Cairo, MD    DATE OF BIRTH:  12/29/1960  DATE OF PROCEDURE:  06/03/2012 DATE OF DISCHARGE:  06/03/2012                              OPERATIVE REPORT   PREOPERATIVE DIAGNOSES: 1. Atypical glandular cells of undetermined significance. 2. Low-grade dysplasia.  POSTOPERATIVE DIAGNOSES: 1. Atypical glandular cells of undetermined significance. 2. Low-grade dysplasia, pathology pending.  PROCEDURES: 1. Hysteroscopy. 2. Dilation and curettage. 3. Cold knife conization.  SURGEON:  Renaldo Fiddler.  SPECIMENS: 1. Endometrial curettings 2. Cone biopsy specimen in multiple segments.  ANESTHESIA:  General.  COMPLICATIONS:  None.  ESTIMATED BLOOD LOSS:  Less than 100 mL.  CONDITION:  Stable and extubated to recovery room.  DESCRIPTION OF PROCEDURE:  Marisa Mitchell was taken to the operating room after informed consent was obtained.  She was given general anesthesia. Placed in the dorsal lithotomy position using Allen stirrups.  She was prepped and draped in sterile fashion.  An in-and-out catheter was used to drain her bladder.  Bivalve speculum was placed in the vagina. Single-tooth tenaculum placed on the anterior lip of the cervix.  The cervix was serially dilated using Pratt dilators and the diagnostic hysteroscope was inserted into the endometrial cavity.  Endometrial cavity was surveyed and noted to be without masses or abnormalities. Bilateral cornea were identified and appeared normal.  Hysteroscope was removed and a gentle curetting was performed.  Specimen was placed on Telfa and passed off to be sent to Pathology.  Bivalve speculum and tenaculum were removed.  Weighted speculum was placed in the posterior vagina and a Deaver was placed anteriorly.  Retention sutures were placed at 3 o'clock and 9 o'clock on the  cervix using Vicryl.  The scalpel was used to make a circumferential incision around the cervix. The internal specimen was grasped with an Allis clamp, and curved Mayo scissors were used to resect the ectocervix and endocervical canal. Because of the small size of the cervix and specimen, the cervix was quite friable, it was therefore removed in multiple pieces which were labeled appropriately.  The base and edges of the ectocervix were then cauterized using the Bovie.  Gelfoam was placed in the endocervical canal and the retention sutures were tied together.  Hemostasis was noted.  Retractors were removed.  The patient was extubated and taken to the recovery room in stable condition.  Sponge, lap, needle, and instrument counts were correct x2.     Zelphia Cairo, MD     GA/MEDQ  D:  06/03/2012  T:  06/04/2012  Job:  [id number]

## 2012-06-10 ENCOUNTER — Ambulatory Visit (INDEPENDENT_AMBULATORY_CARE_PROVIDER_SITE_OTHER): Payer: Commercial Indemnity | Admitting: General Surgery

## 2012-06-14 ENCOUNTER — Encounter (HOSPITAL_COMMUNITY): Payer: Self-pay | Admitting: Pharmacy Technician

## 2012-06-14 ENCOUNTER — Ambulatory Visit (INDEPENDENT_AMBULATORY_CARE_PROVIDER_SITE_OTHER): Payer: Commercial Indemnity | Admitting: Internal Medicine

## 2012-06-23 MED ORDER — SODIUM CHLORIDE 0.45 % IV SOLN
INTRAVENOUS | Status: DC
  Administered 2012-06-24: 14:00:00 via INTRAVENOUS

## 2012-06-24 ENCOUNTER — Ambulatory Visit (HOSPITAL_COMMUNITY)
Admission: RE | Admit: 2012-06-24 | Discharge: 2012-06-24 | Disposition: A | Discharge status: Home / Self Care | Payer: Commercial Indemnity | Source: Ambulatory Visit | Attending: Internal Medicine | Admitting: Internal Medicine

## 2012-06-24 ENCOUNTER — Encounter (HOSPITAL_COMMUNITY)
Admission: RE | Disposition: A | Discharge status: Home / Self Care | Payer: Self-pay | Source: Ambulatory Visit | Attending: Internal Medicine

## 2012-06-24 DIAGNOSIS — Z1211 Encounter for screening for malignant neoplasm of colon: Secondary | ICD-10-CM

## 2012-06-24 DIAGNOSIS — K644 Residual hemorrhoidal skin tags: Secondary | ICD-10-CM

## 2012-06-24 HISTORY — PX: COLONOSCOPY: SHX5424

## 2012-06-24 SURGERY — COLONOSCOPY
Anesthesia: Moderate Sedation

## 2012-06-24 MED ORDER — MEPERIDINE HCL 50 MG/ML IJ SOLN
INTRAMUSCULAR | Status: DC | PRN
  Administered 2012-06-24 (×4): 25 mg via INTRAVENOUS

## 2012-06-24 MED ORDER — PROMETHAZINE HCL 25 MG/ML IJ SOLN
INTRAMUSCULAR | Status: DC | PRN
  Administered 2012-06-24: 12.5 mg via INTRAVENOUS

## 2012-06-24 MED ORDER — MIDAZOLAM HCL 5 MG/5ML IJ SOLN
INTRAMUSCULAR | Status: AC
  Filled 2012-06-24: qty 5

## 2012-06-24 MED ORDER — MEPERIDINE HCL 50 MG/ML IJ SOLN
INTRAMUSCULAR | Status: AC
  Filled 2012-06-24: qty 1

## 2012-06-24 MED ORDER — MIDAZOLAM HCL 5 MG/5ML IJ SOLN
INTRAMUSCULAR | Status: DC | PRN
  Administered 2012-06-24 (×2): 2 mg via INTRAVENOUS
  Administered 2012-06-24: 3 mg via INTRAVENOUS
  Administered 2012-06-24 (×2): 2 mg via INTRAVENOUS
  Administered 2012-06-24: 3 mg via INTRAVENOUS

## 2012-06-24 MED ORDER — STERILE WATER FOR IRRIGATION IR SOLN
Status: DC | PRN
  Administered 2012-06-24: 14:00:00

## 2012-06-24 MED ORDER — PROMETHAZINE HCL 25 MG/ML IJ SOLN
INTRAMUSCULAR | Status: AC
  Filled 2012-06-24: qty 1

## 2012-06-24 MED ORDER — MIDAZOLAM HCL 5 MG/5ML IJ SOLN
INTRAMUSCULAR | Status: AC
  Filled 2012-06-24: qty 10

## 2012-06-24 MED ORDER — SODIUM CHLORIDE 0.9 % IJ SOLN
INTRAMUSCULAR | Status: AC
  Filled 2012-06-24: qty 10

## 2012-06-24 NOTE — H&P (Signed)
Marisa Mitchell is an 51 y.o. female.   Chief Complaint: Patient is here for colonoscopy. HPI: Patient is 51 year old Caucasian female who is in for screening colonoscopy. She denies abdominal pain melena or rectal bleeding. She was recently found to have abdominal wall hernia in left lower quadrant and is planning to have it fixed. She was seen by Dr. Ovidio Kin who recommended preprocedure colonoscopy. Family history is negative for colorectal carcinoma. Past Medical History  Diagnosis Date  . Headache   . GERD (gastroesophageal reflux disease)     Past Surgical History  Procedure Date  . Cesarean section     x2  . Appendectomy   . Cervical conization w/bx 06/03/2012    Procedure: CONIZATION CERVIX WITH BIOPSY;  Surgeon: Zelphia Cairo, MD;  Location: [facility];  Service: Gynecology;  Laterality: N/A;  . Hysteroscopy w/d&c 06/03/2012    Procedure: DILATATION AND CURETTAGE /HYSTEROSCOPY;  Surgeon: Zelphia Cairo, MD;  Location: [facility];  Service: Gynecology;  Laterality: N/A;    No family history on file. Social History:  reports that she has never smoked. She does not have any smokeless tobacco history on file. She reports that she does not drink alcohol or use illicit drugs.  Allergies:  Allergies  Allergen Reactions  . Codeine Other (See Comments)    Stomach Cramps    Medications Prior to Admission  Medication Sig Dispense Refill  . cetirizine (ZYRTEC) 10 MG tablet Take 10 mg by mouth daily as needed. For allergies      . ibuprofen (ADVIL,MOTRIN) 200 MG tablet Take 600 mg by mouth every 6 (six) hours as needed. For pain      . Norethindrone Acetate-Ethinyl Estradiol (LOESTRIN 1.5/30, 21,) 1.5-30 MG-MCG tablet Take 1 tablet by mouth daily.        Marland Kitchen omeprazole-sodium bicarbonate (ZEGERID) 40-1100 MG per capsule Take 1 capsule by mouth daily before breakfast.        No results found for this or any previous visit (from the past 48 hour(s)). No results found.  ROS  Blood  pressure 150/83, pulse 90, temperature 98 F (36.7 C), temperature source Oral, resp. rate 18, SpO2 98.00%. Physical Exam  Constitutional: She appears well-developed and well-nourished.  HENT:  Mouth/Throat: Oropharynx is clear and moist.  Eyes: Conjunctivae normal are normal. No scleral icterus.  Neck: No thyromegaly present.  GI: She exhibits no distension and no mass. There is no tenderness.       Difficult to feel hernia in supine position a positive cough impulse at LLQ  Musculoskeletal: She exhibits no edema.  Lymphadenopathy:    She has no cervical adenopathy.  Neurological: She is alert.  Skin: Skin is warm and dry.     Assessment/Plan Average risk screening colonoscopy.  Damontae Loppnow U 06/24/2012, 2:40 PM

## 2012-06-24 NOTE — Op Note (Signed)
COLONOSCOPY PROCEDURE REPORT  PATIENT:  Marisa Mitchell  MR#:  [id number] Birthdate:  [birth year], 51 y.o., female Endoscopist:  Dr. Malissa Hippo, MD Referred By:  Dr.  Colette Ribas, MD Procedure Date: 06/24/2012  Procedure:   Colonoscopy  Indications:  Patient is 51 year old Caucasian female who is undergoing average risk screening colonoscopy. She has seen Dr. Ovidio Kin for left-sided spigelian hernia. He recommended that she should undergo screening colonoscopy prior to repair of this hernia.  Informed Consent:  The procedure and risks were reviewed with the patient and informed consent was obtained.  Medications:  Demerol 100 mg IV Versed 14 mg IV Promethazine 12.5 mg IV in diluted form.  Description of procedure:  After a digital rectal exam was performed, that colonoscope was advanced from the anus through the rectum and colon to the area of the cecum, ileocecal valve and appendiceal orifice. The cecum was deeply intubated. These structures were well-seen and photographed for the record. From the level of the cecum and ileocecal valve, the scope was slowly and cautiously withdrawn. The mucosal surfaces were carefully surveyed utilizing scope tip to flexion to facilitate fold flattening as needed. The scope was pulled down into the rectum where a thorough exam including retroflexion was performed.  Findings:  Prep excellent. Adherent  stool noted at appendectomy stump. It would not wash away with the jet of water therefore folds of a with biopsy forceps revealing metallic clip from prior appendectomy. Mucosa of rest of the colon and rectum was normal. Small hemorrhoids below the dentate line.  Therapeutic/Diagnostic Maneuvers Performed:  See above  Complications:  None  Cecal Withdrawal Time:  9 minutes  Impression:  Normal colonoscopy except small external hemorrhoids. Adherent piece of stool and appendiceal stump dislodged with biopsy foreceps.  Recommendations:    Standard instructions given. She can proceed with repair of spigelian hernia.  Next screening in 10 years.   REHMAN,NAJEEB U  06/24/2012 3:24 PM  CC: Dr. Colette Ribas, MD & Dr. Bonnetta Barry ref. provider found

## 2012-06-28 ENCOUNTER — Other Ambulatory Visit (INDEPENDENT_AMBULATORY_CARE_PROVIDER_SITE_OTHER): Payer: Self-pay | Admitting: Surgery

## 2012-06-30 ENCOUNTER — Encounter (HOSPITAL_COMMUNITY): Payer: Self-pay | Admitting: Internal Medicine

## 2012-07-01 ENCOUNTER — Other Ambulatory Visit (INDEPENDENT_AMBULATORY_CARE_PROVIDER_SITE_OTHER): Payer: Self-pay | Admitting: Surgery

## 2012-07-02 ENCOUNTER — Encounter (HOSPITAL_COMMUNITY): Payer: Self-pay | Admitting: *Deleted

## 2012-07-07 ENCOUNTER — Encounter (HOSPITAL_COMMUNITY)
Admission: RE | Disposition: A | Discharge status: Home / Self Care | Payer: Self-pay | Source: Ambulatory Visit | Attending: Surgery

## 2012-07-07 ENCOUNTER — Telehealth (INDEPENDENT_AMBULATORY_CARE_PROVIDER_SITE_OTHER): Payer: Self-pay | Admitting: General Surgery

## 2012-07-07 ENCOUNTER — Encounter (HOSPITAL_COMMUNITY): Payer: Self-pay | Admitting: *Deleted

## 2012-07-07 ENCOUNTER — Ambulatory Visit (HOSPITAL_COMMUNITY): Payer: Commercial Indemnity | Admitting: *Deleted

## 2012-07-07 ENCOUNTER — Ambulatory Visit (HOSPITAL_COMMUNITY)
Admission: RE | Admit: 2012-07-07 | Discharge: 2012-07-08 | Disposition: A | Discharge status: Home / Self Care | Payer: Commercial Indemnity | Source: Ambulatory Visit | Attending: General Surgery | Admitting: General Surgery

## 2012-07-07 DIAGNOSIS — Z79899 Other long term (current) drug therapy: Secondary | ICD-10-CM | POA: Insufficient documentation

## 2012-07-07 DIAGNOSIS — K469 Unspecified abdominal hernia without obstruction or gangrene: Secondary | ICD-10-CM

## 2012-07-07 DIAGNOSIS — Z87440 Personal history of urinary (tract) infections: Secondary | ICD-10-CM | POA: Insufficient documentation

## 2012-07-07 DIAGNOSIS — R87619 Unspecified abnormal cytological findings in specimens from cervix uteri: Secondary | ICD-10-CM | POA: Insufficient documentation

## 2012-07-07 DIAGNOSIS — K439 Ventral hernia without obstruction or gangrene: Secondary | ICD-10-CM

## 2012-07-07 DIAGNOSIS — K219 Gastro-esophageal reflux disease without esophagitis: Secondary | ICD-10-CM | POA: Insufficient documentation

## 2012-07-07 DIAGNOSIS — K8020 Calculus of gallbladder without cholecystitis without obstruction: Secondary | ICD-10-CM | POA: Insufficient documentation

## 2012-07-07 HISTORY — PX: VENTRAL HERNIA REPAIR: SHX424

## 2012-07-07 LAB — CBC
MCH: 29.9 pg (ref 26.0–34.0)
MCHC: 33.9 g/dL (ref 30.0–36.0)
MCV: 88.3 fL (ref 78.0–100.0)
Platelets: 262 10*3/uL (ref 150–400)
RDW: 12.6 % (ref 11.5–15.5)
WBC: 10.7 10*3/uL — ABNORMAL HIGH (ref 4.0–10.5)

## 2012-07-07 SURGERY — REPAIR, HERNIA, VENTRAL, LAPAROSCOPIC
Anesthesia: General | Laterality: Left | Wound class: Clean

## 2012-07-07 MED ORDER — DEXAMETHASONE SODIUM PHOSPHATE 10 MG/ML IJ SOLN
INTRAMUSCULAR | Status: DC | PRN
  Administered 2012-07-07: 10 mg via INTRAVENOUS

## 2012-07-07 MED ORDER — CHLORHEXIDINE GLUCONATE 4 % EX LIQD
1.0000 "application " | Freq: Once | CUTANEOUS | Status: DC
  Filled 2012-07-07: qty 15

## 2012-07-07 MED ORDER — CEFAZOLIN SODIUM-DEXTROSE 2-3 GM-% IV SOLR
INTRAVENOUS | Status: AC
  Filled 2012-07-07: qty 50

## 2012-07-07 MED ORDER — CISATRACURIUM BESYLATE (PF) 10 MG/5ML IV SOLN
INTRAVENOUS | Status: DC | PRN
  Administered 2012-07-07: 10 mg via INTRAVENOUS

## 2012-07-07 MED ORDER — PROPOFOL 10 MG/ML IV BOLUS
INTRAVENOUS | Status: DC | PRN
  Administered 2012-07-07: 200 mg via INTRAVENOUS

## 2012-07-07 MED ORDER — CEFAZOLIN SODIUM-DEXTROSE 2-3 GM-% IV SOLR
2.0000 g | INTRAVENOUS | Status: AC
  Administered 2012-07-07: 2 g via INTRAVENOUS

## 2012-07-07 MED ORDER — MIDAZOLAM HCL 5 MG/5ML IJ SOLN
INTRAMUSCULAR | Status: DC | PRN
  Administered 2012-07-07: 2 mg via INTRAVENOUS

## 2012-07-07 MED ORDER — LIDOCAINE HCL (CARDIAC) 20 MG/ML IV SOLN
INTRAVENOUS | Status: DC | PRN
  Administered 2012-07-07: 100 mg via INTRAVENOUS

## 2012-07-07 MED ORDER — ACETAMINOPHEN 10 MG/ML IV SOLN
INTRAVENOUS | Status: AC
  Filled 2012-07-07: qty 100

## 2012-07-07 MED ORDER — HYDROCODONE-ACETAMINOPHEN 5-325 MG PO TABS
1.0000 | ORAL_TABLET | ORAL | Status: DC | PRN
  Administered 2012-07-07: 1 via ORAL
  Filled 2012-07-07: qty 1

## 2012-07-07 MED ORDER — MUPIROCIN 2 % EX OINT
TOPICAL_OINTMENT | Freq: Once | CUTANEOUS | Status: AC
  Administered 2012-07-07: 1 via NASAL

## 2012-07-07 MED ORDER — ONDANSETRON HCL 4 MG/2ML IJ SOLN: 4.0000 mg | Freq: Four times a day (QID) | INTRAMUSCULAR | Status: DC | PRN

## 2012-07-07 MED ORDER — HYDROMORPHONE HCL PF 1 MG/ML IJ SOLN
INTRAMUSCULAR | Status: AC
  Filled 2012-07-07: qty 1

## 2012-07-07 MED ORDER — PROMETHAZINE HCL 25 MG/ML IJ SOLN: 6.2500 mg | INTRAMUSCULAR | Status: DC | PRN

## 2012-07-07 MED ORDER — MORPHINE SULFATE 10 MG/ML IJ SOLN: 1.0000 mg | INTRAMUSCULAR | Status: DC | PRN

## 2012-07-07 MED ORDER — SUFENTANIL CITRATE 50 MCG/ML IV SOLN
INTRAVENOUS | Status: DC | PRN
  Administered 2012-07-07: 20 ug via INTRAVENOUS
  Administered 2012-07-07 (×3): 10 ug via INTRAVENOUS

## 2012-07-07 MED ORDER — ACETAMINOPHEN 10 MG/ML IV SOLN
INTRAVENOUS | Status: DC | PRN
  Administered 2012-07-07: 1000 mg via INTRAVENOUS

## 2012-07-07 MED ORDER — ONDANSETRON HCL 4 MG PO TABS
4.0000 mg | ORAL_TABLET | Freq: Four times a day (QID) | ORAL | Status: DC | PRN
  Filled 2012-07-07: qty 1

## 2012-07-07 MED ORDER — INFLUENZA VIRUS VACC SPLIT PF IM SUSP
0.5000 mL | INTRAMUSCULAR | Status: AC
  Administered 2012-07-08: 0.5 mL via INTRAMUSCULAR
  Filled 2012-07-07: qty 0.5

## 2012-07-07 MED ORDER — HYDROMORPHONE HCL PF 1 MG/ML IJ SOLN
0.2500 mg | INTRAMUSCULAR | Status: DC | PRN
  Administered 2012-07-07 (×2): 0.5 mg via INTRAVENOUS

## 2012-07-07 MED ORDER — IBUPROFEN 600 MG PO TABS
600.0000 mg | ORAL_TABLET | Freq: Four times a day (QID) | ORAL | Status: DC | PRN
  Administered 2012-07-08: 600 mg via ORAL
  Filled 2012-07-07: qty 1

## 2012-07-07 MED ORDER — ENOXAPARIN SODIUM 40 MG/0.4ML ~~LOC~~ SOLN
40.0000 mg | SUBCUTANEOUS | Status: DC
  Administered 2012-07-08: 40 mg via SUBCUTANEOUS
  Filled 2012-07-07 (×2): qty 0.4

## 2012-07-07 MED ORDER — NEOSTIGMINE METHYLSULFATE 1 MG/ML IJ SOLN
INTRAMUSCULAR | Status: DC | PRN
  Administered 2012-07-07: 4 mg via INTRAVENOUS

## 2012-07-07 MED ORDER — GLYCOPYRROLATE 0.2 MG/ML IJ SOLN
INTRAMUSCULAR | Status: DC | PRN
  Administered 2012-07-07: .6 mg via INTRAVENOUS

## 2012-07-07 MED ORDER — MUPIROCIN 2 % EX OINT
TOPICAL_OINTMENT | CUTANEOUS | Status: AC
  Filled 2012-07-07: qty 22

## 2012-07-07 MED ORDER — BUPIVACAINE HCL 0.25 % IJ SOLN
INTRAMUSCULAR | Status: DC | PRN
  Administered 2012-07-07: 10 mL

## 2012-07-07 MED ORDER — 0.9 % SODIUM CHLORIDE (POUR BTL) OPTIME
TOPICAL | Status: DC | PRN
  Administered 2012-07-07: 1000 mL

## 2012-07-07 MED ORDER — MORPHINE SULFATE 2 MG/ML IJ SOLN
1.0000 mg | INTRAMUSCULAR | Status: DC | PRN
  Administered 2012-07-07 (×2): 2 mg via INTRAVENOUS
  Filled 2012-07-07: qty 1

## 2012-07-07 MED ORDER — ACETAMINOPHEN 10 MG/ML IV SOLN
1000.0000 mg | Freq: Four times a day (QID) | INTRAVENOUS | Status: AC
  Administered 2012-07-07 (×2): 1000 mg via INTRAVENOUS
  Filled 2012-07-07 (×3): qty 100

## 2012-07-07 MED ORDER — LACTATED RINGERS IV SOLN
INTRAVENOUS | Status: DC | PRN
  Administered 2012-07-07 (×2): via INTRAVENOUS

## 2012-07-07 MED ORDER — LORATADINE 10 MG PO TABS
10.0000 mg | ORAL_TABLET | Freq: Every day | ORAL | Status: DC
  Administered 2012-07-08: 10 mg via ORAL
  Filled 2012-07-07 (×2): qty 1

## 2012-07-07 MED ORDER — MORPHINE SULFATE 2 MG/ML IJ SOLN
INTRAMUSCULAR | Status: AC
  Filled 2012-07-07: qty 1

## 2012-07-07 MED ORDER — POTASSIUM CHLORIDE IN NACL 20-0.45 MEQ/L-% IV SOLN
INTRAVENOUS | Status: DC
  Administered 2012-07-07 – 2012-07-08 (×2): via INTRAVENOUS
  Filled 2012-07-07 (×8): qty 1000

## 2012-07-07 MED ORDER — ONDANSETRON HCL 4 MG/2ML IJ SOLN
INTRAMUSCULAR | Status: DC | PRN
  Administered 2012-07-07: 4 mg via INTRAVENOUS

## 2012-07-07 MED ORDER — LACTATED RINGERS IV SOLN: INTRAVENOUS | Status: DC

## 2012-07-07 MED ORDER — BUPIVACAINE HCL (PF) 0.25 % IJ SOLN
10.0000 mL | INTRAMUSCULAR | Status: DC
  Filled 2012-07-07 (×3): qty 10

## 2012-07-07 SURGICAL SUPPLY — 47 items
BENZOIN TINCTURE PRP APPL 2/3 (GAUZE/BANDAGES/DRESSINGS) ×3 IMPLANT
BINDER ABD UNIV 12 45-62 (WOUND CARE) ×2 IMPLANT
BINDER ABDOMINAL 46IN 62IN (WOUND CARE) ×3
CANISTER SUCTION 2500CC (MISCELLANEOUS) ×3 IMPLANT
CHLORAPREP W/TINT 26ML (MISCELLANEOUS) ×3 IMPLANT
CLOTH BEACON ORANGE TIMEOUT ST (SAFETY) ×3 IMPLANT
COVER SURGICAL LIGHT HANDLE (MISCELLANEOUS) ×3 IMPLANT
DECANTER SPIKE VIAL GLASS SM (MISCELLANEOUS) IMPLANT
DERMABOND ADVANCED (GAUZE/BANDAGES/DRESSINGS) ×2
DERMABOND ADVANCED .7 DNX12 (GAUZE/BANDAGES/DRESSINGS) ×4 IMPLANT
DEVICE SECURE STRAP 25 ABSORB (INSTRUMENTS) ×3 IMPLANT
DEVICE TROCAR PUNCTURE CLOSURE (ENDOMECHANICALS) ×3 IMPLANT
DISSECTOR BLUNT TIP ENDO 5MM (MISCELLANEOUS) IMPLANT
DRAIN CHANNEL RND F F (WOUND CARE) IMPLANT
DRAPE INCISE IOBAN 66X45 STRL (DRAPES) ×3 IMPLANT
DRAPE LAPAROSCOPIC ABDOMINAL (DRAPES) ×3 IMPLANT
ELECT REM PT RETURN 9FT ADLT (ELECTROSURGICAL) ×3
ELECTRODE REM PT RTRN 9FT ADLT (ELECTROSURGICAL) ×2 IMPLANT
EVACUATOR SILICONE 100CC (DRAIN) IMPLANT
GLOVE BIOGEL PI IND STRL 7.0 (GLOVE) ×2 IMPLANT
GLOVE BIOGEL PI INDICATOR 7.0 (GLOVE) ×1
GLOVE SURG SIGNA 7.5 PF LTX (GLOVE) ×3 IMPLANT
GOWN STRL NON-REIN LRG LVL3 (GOWN DISPOSABLE) IMPLANT
GOWN STRL REIN XL XLG (GOWN DISPOSABLE) ×9 IMPLANT
KIT BASIN OR (CUSTOM PROCEDURE TRAY) ×3 IMPLANT
MESH PARIETEX 4.7 (Mesh General) ×3 IMPLANT
NEEDLE INSUFFLATION 14GA 150MM (NEEDLE) IMPLANT
NEEDLE SPNL 22GX3.5 QUINCKE BK (NEEDLE) ×3 IMPLANT
NS IRRIG 1000ML POUR BTL (IV SOLUTION) ×3 IMPLANT
PEN SKIN MARKING BROAD (MISCELLANEOUS) ×3 IMPLANT
PENCIL BUTTON HOLSTER BLD 10FT (ELECTRODE) IMPLANT
SCALPEL HARMONIC ACE (MISCELLANEOUS) IMPLANT
SCISSORS LAP 5X35 DISP (ENDOMECHANICALS) IMPLANT
SET IRRIG TUBING LAPAROSCOPIC (IRRIGATION / IRRIGATOR) IMPLANT
SOLUTION ANTI FOG 6CC (MISCELLANEOUS) ×3 IMPLANT
STAPLER VISISTAT 35W (STAPLE) ×3 IMPLANT
STRIP CLOSURE SKIN 1/4X4 (GAUZE/BANDAGES/DRESSINGS) ×3 IMPLANT
SUT NOVA 0 T19/GS 22DT (SUTURE) ×6 IMPLANT
SUT NOVA NAB DX-16 0-1 5-0 T12 (SUTURE) ×3 IMPLANT
SUT VIC AB 5-0 PS2 18 (SUTURE) ×3 IMPLANT
TACKER 5MM HERNIA 3.5CML NAB (ENDOMECHANICALS) IMPLANT
TOWEL OR 17X26 10 PK STRL BLUE (TOWEL DISPOSABLE) ×6 IMPLANT
TRAY FOLEY CATH 14FRSI W/METER (CATHETERS) ×3 IMPLANT
TRAY LAP CHOLE (CUSTOM PROCEDURE TRAY) ×3 IMPLANT
TROCAR BLADELESS OPT 5 75 (ENDOMECHANICALS) ×6 IMPLANT
TROCAR XCEL NON-BLD 11X100MML (ENDOMECHANICALS) ×3 IMPLANT
TUBING INSUFFLATION 10FT LAP (TUBING) ×3 IMPLANT

## 2012-07-07 NOTE — Transfer of Care (Signed)
Immediate Anesthesia Transfer of Care Note  Patient: Marisa Mitchell  Procedure(s) Performed: Procedure(s) (LRB) with comments: LAPAROSCOPIC VENTRAL HERNIA (Left) - Laparoscopic Repair of Left Spigelian hernia INSERTION OF MESH ()  Patient Location: PACU  Anesthesia Type: General  Level of Consciousness: awake and oriented  Airway & Oxygen Therapy: Patient Spontanous Breathing and Patient connected to face mask oxygen  Post-op Assessment: Report given to PACU RN and Post -op Vital signs reviewed and stable  Post vital signs: Reviewed and stable  Complications: No apparent anesthesia complications

## 2012-07-07 NOTE — Anesthesia Preprocedure Evaluation (Signed)
Anesthesia Evaluation  Patient identified by MRN, date of birth, ID band Patient awake    Reviewed: Allergy & Precautions, H&P , NPO status , Patient's Chart, lab work & pertinent test results  Airway Mallampati: II TM Distance: >3 FB Neck ROM: Full    Dental No notable dental hx.    Pulmonary neg pulmonary ROS,  breath sounds clear to auscultation  Pulmonary exam normal       Cardiovascular negative cardio ROS  Rhythm:Regular Rate:Normal     Neuro/Psych  Headaches, negative psych ROS   GI/Hepatic Neg liver ROS, GERD-  Medicated,  Endo/Other  negative endocrine ROS  Renal/GU negative Renal ROS  negative genitourinary   Musculoskeletal negative musculoskeletal ROS (+)   Abdominal   Peds negative pediatric ROS (+)  Hematology negative hematology ROS (+)   Anesthesia Other Findings   Reproductive/Obstetrics negative OB ROS                           Anesthesia Physical Anesthesia Plan  ASA: II  Anesthesia Plan: General   Post-op Pain Management:    Induction: Intravenous  Airway Management Planned: Oral ETT  Additional Equipment:   Intra-op Plan:   Post-operative Plan: Extubation in OR  Informed Consent: I have reviewed the patients History and Physical, chart, labs and discussed the procedure including the risks, benefits and alternatives for the proposed anesthesia with the patient or authorized representative who has indicated his/her understanding and acceptance.   Dental advisory given  Plan Discussed with: CRNA  Anesthesia Plan Comments:         Anesthesia Quick Evaluation

## 2012-07-07 NOTE — Telephone Encounter (Signed)
LMOM letting pt know her 1st PO appt w/ Dr. Ezzard Standing will be on 10/23 at 10:20.  I also sent a reminder card in the mail.

## 2012-07-07 NOTE — Anesthesia Postprocedure Evaluation (Signed)
  Anesthesia Post-op Note  Patient: Marisa Mitchell  Procedure(s) Performed: Procedure(s) (LRB): LAPAROSCOPIC VENTRAL HERNIA (Left) INSERTION OF MESH ()  Patient Location: PACU  Anesthesia Type: General  Level of Consciousness: awake and alert   Airway and Oxygen Therapy: Patient Spontanous Breathing  Post-op Pain: mild  Post-op Assessment: Post-op Vital signs reviewed, Patient's Cardiovascular Status Stable, Respiratory Function Stable, Patent Airway and No signs of Nausea or vomiting  Post-op Vital Signs: stable  Complications: No apparent anesthesia complications

## 2012-07-07 NOTE — Progress Notes (Signed)
NOS  Looks good.  Hurt a fair amount earlier.  Has taken liquids. Binder in place.  Ovidio Kin, MD, [facility] Pager: [phone] Office phone:  [phone]

## 2012-07-07 NOTE — Brief Op Note (Signed)
07/07/2012  10:24 AM  PATIENT:  Marisa Mitchell, 51 y.o., female, MRN: [id number]  PREOP DIAGNOSIS:  Left spigelian and peri-umbilical hernia  POSTOP DIAGNOSIS:   Left spigelian hernia  PROCEDURE:   Procedure(s): LAPAROSCOPIC VENTRAL HERNIA, INSERTION OF MESH - Parietex  SURGEON:   Ovidio Kin, M.D.  ASSISTANT:   None  ANESTHESIA:   general  Azell Der, MD - Anesthesiologist Florene Route, CRNA - CRNA  General  EBL:  min  ml  BLOOD ADMINISTERED: none  DRAINS: none   LOCAL MEDICATIONS USED:   10 cc 1/4% marcaine  SPECIMEN:   none  COUNTS CORRECT:  YES  INDICATIONS FOR PROCEDURE:  Marisa Mitchell is a 51 y.o. (DOB: Feb 12, 1961) white female whose primary care physician is Colette Ribas, MD and comes for repair of spigelian and per-umbilical hernia   The indications and risks of the surgery were explained to the patient.  The risks include, but are not limited to, infection, bleeding, and nerve injury.  Note dictated to:   #[id number]  Type of repair - Mesh  (choices - primary suture, mesh, or component)  Name of mesh - Composite Parietex  Size of mesh - Length 12 cm, Width 12 cm  Mesh overlap - 4 cm  Placement of mesh - beneath the fascia and into the peritoneal cavity  (choices - beneath fascia and into peritoneal cavity, beneath fascia but external to peritoneal cavity, between the muscle and fascia, above or external to fascia)

## 2012-07-07 NOTE — H&P (Signed)
Re: Marisa Mitchell  DOB: [birth year]  MRN: [id number]   ASSESSMENT AND PLAN:  1. Hernia, spigelian, left sided   I discussed the indications and complications of hernia surgery with the patient. I discussed both the laparoscopic and open approach to hernia repair.. The potential risks of hernia surgery include, but are not limited to, bleeding, infection, open surgery, nerve injury, and recurrence of the hernia. I provided the patient literature about hernia surgery.   She understands planned surgery.  Her husband is here with her today.  2. Periumbilical hernia  3. Gallstones - asymptomatic   I gave her literature on gall bladder disease.  4. Recent UTI - cleared  5. Atypical pap   D&C - Dr. Luna Kitchens  6. Colonoscopy with Dr. Karilyn Cota - 06/24/2012 - neg  Chief Complaint   Patient presents with   .  Pre-op Exam    REFERRING PHYSICIAN: Colette Ribas, MD   HISTORY OF PRESENT ILLNESS:  Marisa Mitchell is a 51 y.o. (DOB: 12/16/1960) white female whose primary care physician is Colette Ribas, MD and comes to me today for abdominal wall hernia.  She noticed some pain about 2-3 weeks ago. She saw her gynecologist, Dr. Esperanza Richters. They did an ultrasound of her pelvis which was negative. She then saw her medical doctor (or his PA) at [facility] in [place]. They thought she had a urinary tract infection, started her on Cipro to switch her to Macrobid.   At this time she felt a mass in her left abdomen. She saw Dr. Esperanza Richters again who obtained a CT scan on 05/25/2012 that showed: #1 spigelian hernia, #2 periumbilical hernia, #3 gallstones. She has known about the gallstones since an appendectomy (by Dr. Caesar Bookman) last year, but these have been asymptomatic.  She is here to discuss the hernia.   Past Medical History   Diagnosis  Date   .  Headache    .  GERD (gastroesophageal reflux disease)     Past Surgical History   Procedure  Date   .  Cesarean section      x2   .   Appendectomy     Current Outpatient Prescriptions   Medication  Sig  Dispense  Refill   .  cetirizine (ZYRTEC) 10 MG tablet  Take 10 mg by mouth daily as needed. For allergies     .  Norethindrone Acetate-Ethinyl Estradiol (LOESTRIN 1.5/30, 21,) 1.5-30 MG-MCG tablet  Take 1 tablet by mouth daily.      Allergies   Allergen  Reactions   .  Codeine  Other (See Comments)     Stomach Cramps    REVIEW OF SYSTEMS:  Skin: No history of rash. No history of abnormal moles.  Infection: No history of hepatitis or HIV. No history of MRSA.  Neurologic: No history of stroke. No history of seizure. No history of headaches.  Cardiac: No history of hypertension. No history of heart disease. No history of prior cardiac catheterization. No history of seeing a cardiologist.  Pulmonary: Does not smoke cigarettes. No asthma or bronchitis. No OSA/CPAP.  Endocrine: No diabetes. No thyroid disease.  Gastrointestinal: No history of stomach disease. No history of liver disease. No history of gall bladder disease. No history of pancreas disease. Colonscopy with Dr. Karilyn Cota - neg. Urologic: No history of kidney stones. No history of bladder infections.  Musculoskeletal: No history of joint or back disease.  Hematologic: No bleeding disorder. No history of anemia. Not anticoagulated.  Psycho-social: The patient is  oriented. The patient has no obvious psychologic or social impairment to understanding our conversation and plan.   SOCIAL and FAMILY HISTORY:  Works at [employer] in [place]  She is married.   PHYSICAL EXAM:  BP 127/69  Pulse 74  Temp 97.5 F (36.4 C)  Resp 20  Ht 5\' 6"  (1.676 m)  Wt 206 lb 2 oz (93.498 kg)  BMI 33.27 kg/m2  SpO2 100%  General: WN WF who is alert and generally healthy appearing.  HEENT: Normal. Pupils equal.  Neck: Supple. No mass. No thyroid mass.  Lymph Nodes: No supraclavicular or cervical nodes.  Lungs: Clear to auscultation and symmetric breath sounds.  Heart: RRR.  No murmur or rub.  Abdomen: Soft. No mass. No tenderness. Pfannensteil incision. Small umbilical hernia. On standing, I can feel a hernia in the LLQ c/w a Spigelian hernia. Had lap appendectomy by Dr. Leticia Penna in 2012.  Rectal: Not done.  Extremities: Good strength and ROM in upper and lower extremities.  Neurologic: Grossly intact to motor and sensory function.  Psychiatric: Has normal mood and affect. Behavior is normal.   DATA REVIEWED:  CT scan and clinic notes.   Ovidio Kin, MD, [facility]  [address] [place]  [place], [place] [address]  Phone: [phone] FAX: [phone]

## 2012-07-07 NOTE — Anesthesia Procedure Notes (Signed)
Procedure Name: Intubation Date/Time: 07/07/2012 9:11 AM Performed by: Leroy Libman L Patient Re-evaluated:Patient Re-evaluated prior to inductionOxygen Delivery Method: Circle system utilized Preoxygenation: Pre-oxygenation with 100% oxygen Intubation Type: IV induction Ventilation: Mask ventilation without difficulty and Oral airway inserted - appropriate to patient size Laryngoscope Size: Hyacinth Meeker and 2 Grade View: Grade I Tube type: Oral Tube size: 7.5 mm Number of attempts: 1 Airway Equipment and Method: Stylet Placement Confirmation: ETT inserted through vocal cords under direct vision,  breath sounds checked- equal and bilateral and positive ETCO2 Secured at: 21 cm Tube secured with: Tape Dental Injury: Teeth and Oropharynx as per pre-operative assessment

## 2012-07-07 NOTE — Preoperative (Signed)
Beta Blockers   Reason not to administer Beta Blockers:Not Applicable 

## 2012-07-08 ENCOUNTER — Encounter (HOSPITAL_COMMUNITY): Payer: Self-pay | Admitting: Surgery

## 2012-07-08 NOTE — Op Note (Signed)
NAMEJENAVEE, Mitchell             ACCOUNT NO.:  [id number]  MEDICAL RECORD NO.:  [id number]  LOCATION:  1531                         FACILITY:  [facility]  PHYSICIAN:  Sandria Bales. Ezzard Standing, M.D.  DATE OF BIRTH:  October 17, 1960  DATE OF PROCEDURE:  07/07/2012                              OPERATIVE REPORT   PREOPERATIVE DIAGNOSIS:  Left spigelian hernia and periumbilical hernia.  POSTOPERATIVE DIAGNOSIS:  Left spigelian hernia, no evidence of periumbilical hernia.  PROCEDURE:  Laparoscopic ventral hernia repair of the spigelian hernia with 12 cm Parietex mesh.  SURGEON:  Sandria Bales. Ezzard Standing, MD  FIRST ASSISTANT:  None.  ANESTHESIA:  General endotracheal, supervised by Dr. Sherrian Divers.  ESTIMATED BLOOD LOSS:  Minimal.  Local anesthetic was 10 mL of 0.25% Marcaine.  COMPLICATIONS:  None.  INDICATION FOR PROCEDURE:  Marisa Mitchell is a 51 year old white female, who sees Dr. Assunta Found as her primary medical doctor, who has a recently diagnosed left spigelian hernia.  There is also suggestion of having a periumbilical hernia on the CT scan.  She does have a palpable knot near her umbilicus.  I discussed with her about proceeding with repair of these hernias.  I discussed the indications and potential complications of hernia surgery.  The complications of the hernia surgery include, but are not limited to bleeding, infection, open surgery, recurrence of the hernia.  OPERATIVE NOTE:  The patient was taken to room #1, underwent a general endotracheal anesthetic supervised by Dr. Sherrian Divers.  She had both her arms tucked, a Foley catheter in place, given 2 g of Ancef at the initiation of procedure.  The time-out was held and surgical checklist run.  I painted her abdomen with ChloraPrep, sterilely draped and then placed an Ioban drape over the abdominal wall.  I accessed her abdominal cavity with a 10-mm Optiview in the left upper quadrant against the abdominal cavity.  Abdominal exploration  revealed right and left lobes of liver unremarkable.  The gallbladder that I could see was unremarkable.  The stomach was unremarkable.  The bowel that I could see was unremarkable.    I placed 2 additional trocars: one on the right abdomen and then looked for the abdominal wall hernias.  At her periumbilical area, there was no evidence of any fascial defect.  I put a needle through the abdominal wall were I felt the lump and saw nothing in that area.  I took photos of this.  In her left lower quadrant, she has a 4-cm spigelian hernia and photos were taken of this. First, I closed the spigelian hernia with a figure-of-eight suture of #1 Novafil.  I then did an underlay mesh with a 12-cm Parietex composite mesh with 8 holding sutures.  This was placed into the abdominal cavity, pulled against the anterior abdominal wall.  I then used the SecureStrap to tack the edges of the mesh.  I used 25 staples with SecureStrap.  She tolerated the procedure well.  Photos were taken again of the hernia before and after repair and were placed in the chart.  I decompressed the abdomen, saw no weakness around the edge of the hernia repair, and then removed the trocars in turn.  The  patient tolerated the procedure well.  The trocar sites were closed with 5-0 Vicryl suture and painted with Dermabond.  The puncture wounds for the sutures were closed with Steri-Strips and then sterilely dressed and abdominal binder was placed.   The patient was transferred to the recovery room in good condition. Sponge and needle counts were correct at end of the case.  Type of repair - mesh  (choices - primary suture, mesh, or component)  Name of mesh - Parietex composite  Size of mesh - Length 12 cm, Width 12 cm  Mesh overlap - 4 cm  Placement of mesh - beneath fascia and into peritoneal cavity  (choices - beneath fascia and into peritoneal cavity, beneath fascia but external to peritoneal cavity, between the muscle  and fascia, above or external to fascia)   Sandria Bales. Ezzard Standing, M.D., FACS  DHN/MEDQ  D:  07/07/2012  T:  07/08/2012  Job:  [id number]  cc:   Corrie Mckusick, M.D. Fax: [phone]  Zelphia Cairo, MD Fax: [phone]  Lionel December, M.D. Fax: [phone]

## 2012-07-09 NOTE — Care Management Note (Signed)
    Page 1 of 1   07/09/2012     3:23:50 PM   CARE MANAGEMENT NOTE 07/09/2012  Patient:  Marisa Mitchell,Marisa Mitchell   Account Number:  [id number]  Date Initiated:  07/09/2012  Documentation initiated by:  Lorenda Ishihara  Subjective/Objective Assessment:   51 yo female admitted Mitchell/p hernia repair     Action/Plan:   Anticipated DC Date:  07/08/2012   Anticipated DC Plan:  HOME/SELF CARE      DC Planning Services  CM consult      Choice offered to / List presented to:             Status of service:  Completed, signed off Medicare Important Message given?   (If response is "NO", the following Medicare IM given date fields will be blank) Date Medicare IM given:   Date Additional Medicare IM given:    Discharge Disposition:  HOME/SELF CARE  Per UR Regulation:  Reviewed for med. necessity/level of care/duration of stay  If discussed at Long Length of Stay Meetings, dates discussed:    Comments:

## 2012-07-09 NOTE — Discharge Summary (Signed)
Marisa Mitchell, Marisa Mitchell             ACCOUNT NO.:  [id number]  MEDICAL RECORD NO.:  [id number]  LOCATION:  1531                         FACILITY:  [facility]  PHYSICIAN:  Sandria Bales. Ezzard Standing, M.D.  DATE OF BIRTH:  1961/01/23  DATE OF ADMISSION:  07/07/2012 DATE OF DISCHARGE:  07/08/2012                              DISCHARGE SUMMARY   DISCHARGE DIAGNOSES: 1. Left spigelian hernia. 2. No evidence of periumbilical hernia. 3. Asymptomatic gallstones. 4. Recent D and C by Dr. Zelphia Cairo.  OPERATIONS PERFORMED:  The patient underwent a laparoscopic repair of spigelian hernia by Dr. Ovidio Kin on July 07, 2012.  HISTORY OF ILLNESS:  [name] is a 51 year old white female, who sees Dr. Assunta Found as her primary medical doctor.  She has noticed for a couple months an increasing bulge in her left abdomen.  She underwent a CT scan on May 25, 2012 that showed a left spigelian hernia, suggested a periumbilical hernia and gallstones.  I reviewed with the patient laparoscopic repair of spigelian hernia but we would look at the periumbilical hernia at the same time and her gallstones were asymptomatic, so we would leave these alone.  In the interval she underwent a colonoscopy by Dr. Lionel December which was negative and she has had a D and C by Dr. Zelphia Cairo.  HOSPITAL COURSE:  The patient was taken to the operating room on the day of admission where she underwent a laparoscopic repair of a spigelian hernia with mesh by Dr. Ovidio Kin.  Laparoscopy showed no evidence of a periumbilical hernia.  She is now postop and has done very well and is ready for discharge.  She works at a [employer] home.  DISCHARGE INSTRUCTIONS:  She can return to work when ready, she is guessing about a week to 2 weeks.  For activity, she can drive in 1-3 days if doing well.  She should do no lifting of 15 pounds more than 3 weeks. For wound care, she can start showering tomorrow.  She is to wear an  abdominal binder for 4 weeks postop. Her diet is as tolerated. She is to see me back in 2-3 weeks for followup and given Vicodin for pain.  DISCHARGE CONDITION:  Good.   Sandria Bales. Ezzard Standing, M.D., FACS   DHN/MEDQ  D:  07/08/2012  T:  07/09/2012  Job:  [id number]  cc:   Corrie Mckusick, M.D. Fax: [phone]  Zelphia Cairo, MD Fax: [phone]  Lionel December, M.D. Fax: [phone]

## 2012-07-13 ENCOUNTER — Telehealth (INDEPENDENT_AMBULATORY_CARE_PROVIDER_SITE_OTHER): Payer: Self-pay | Admitting: General Surgery

## 2012-07-13 NOTE — Telephone Encounter (Signed)
Patient calling to let us know she is having an "intense burning sensation" at her hernia site. She is having no more soreness like right after surgery. This burning sensation happens at different times, when she is up and moving around it is constant. When she is laying down and not moving she can get it to calm down. Her incisions look good. No redness, drainage or warmth at incisions. No bulging at incisions. She is moving her bowels okay, she had some trouble at first but she is taking stool softeners and they are now getting back to normal. She is having no fevers, no nausea, no vomiting. I made her aware this is probably normal healing and it didn't sound concerning. Made her aware I would let Dr Ezzard Standing know this is going on and we would call her if we needed to see her prior to her appt on 07/21/2012.

## 2012-07-15 NOTE — Telephone Encounter (Signed)
Pt states she is not having any fever,chills,or redness at the procedure site . She is having burning at site at times. I advised her to keep her appt on the 23 because  This is a normal process per Dr. Ezzard Standing

## 2012-07-21 ENCOUNTER — Encounter (INDEPENDENT_AMBULATORY_CARE_PROVIDER_SITE_OTHER): Payer: Self-pay | Admitting: Surgery

## 2012-07-21 ENCOUNTER — Ambulatory Visit (INDEPENDENT_AMBULATORY_CARE_PROVIDER_SITE_OTHER): Payer: Commercial Indemnity | Admitting: Surgery

## 2012-07-21 VITALS — BP 120/80 | HR 72 | Temp 97.9°F | Resp 18 | Ht 66.0 in | Wt 194.0 lb

## 2012-07-21 DIAGNOSIS — K469 Unspecified abdominal hernia without obstruction or gangrene: Secondary | ICD-10-CM

## 2012-07-21 NOTE — Progress Notes (Signed)
Post op visit  Doing well.  Sore early, but seems to be getting better. Wound looks good To limit physical activity for one month from the date of surgery.  She complains of some left back pain, probably from compensation for the abdominal surgery. Return appt PRN.  Ovidio Kin, MD, [facility] Surgery Pager: [phone] Office phone:  [phone]

## 2012-10-27 ENCOUNTER — Encounter (INDEPENDENT_AMBULATORY_CARE_PROVIDER_SITE_OTHER): Payer: Self-pay

## 2013-08-05 ENCOUNTER — Encounter (HOSPITAL_COMMUNITY): Payer: Self-pay | Admitting: Emergency Medicine

## 2013-08-05 ENCOUNTER — Observation Stay (HOSPITAL_COMMUNITY)
Admission: EM | Admit: 2013-08-05 | Discharge: 2013-08-06 | Disposition: A | Discharge status: Home / Self Care | Payer: Commercial Indemnity | Attending: Surgery | Admitting: Surgery

## 2013-08-05 ENCOUNTER — Emergency Department (HOSPITAL_COMMUNITY): Payer: Commercial Indemnity

## 2013-08-05 DIAGNOSIS — Z79899 Other long term (current) drug therapy: Secondary | ICD-10-CM | POA: Insufficient documentation

## 2013-08-05 DIAGNOSIS — K824 Cholesterolosis of gallbladder: Principal | ICD-10-CM | POA: Insufficient documentation

## 2013-08-05 DIAGNOSIS — K219 Gastro-esophageal reflux disease without esophagitis: Secondary | ICD-10-CM | POA: Insufficient documentation

## 2013-08-05 DIAGNOSIS — K8020 Calculus of gallbladder without cholecystitis without obstruction: Secondary | ICD-10-CM | POA: Insufficient documentation

## 2013-08-05 DIAGNOSIS — K429 Umbilical hernia without obstruction or gangrene: Secondary | ICD-10-CM | POA: Insufficient documentation

## 2013-08-05 DIAGNOSIS — Z23 Encounter for immunization: Secondary | ICD-10-CM | POA: Insufficient documentation

## 2013-08-05 DIAGNOSIS — K8050 Calculus of bile duct without cholangitis or cholecystitis without obstruction: Secondary | ICD-10-CM

## 2013-08-05 LAB — BASIC METABOLIC PANEL
Calcium: 9.7 mg/dL (ref 8.4–10.5)
Creatinine, Ser: 0.9 mg/dL (ref 0.50–1.10)
GFR calc Af Amer: 84 mL/min — ABNORMAL LOW (ref >90)
Sodium: 141 mEq/L (ref 135–145)

## 2013-08-05 LAB — CBC
MCH: 31.1 pg (ref 26.0–34.0)
MCV: 88.3 fL (ref 78.0–100.0)
Platelets: 307 10*3/uL (ref 150–400)
RBC: 4.54 MIL/uL (ref 3.87–5.11)
RDW: 12.5 % (ref 11.5–15.5)
WBC: 15.9 10*3/uL — ABNORMAL HIGH (ref 4.0–10.5)

## 2013-08-05 LAB — POCT I-STAT TROPONIN I: Troponin i, poc: 0 ng/mL (ref 0.00–0.08)

## 2013-08-05 LAB — GLUCOSE, CAPILLARY: Glucose-Capillary: 90 mg/dL (ref 70–99)

## 2013-08-05 NOTE — ED Notes (Signed)
Pt reports she has felt like she has indigestion since 1300 today, pt reports this indigestion feels like pressure in her chest. Pt reports a hx of GERD. Pt states she has been seeing her OBGYN for hormone control therapy. Pt reports she has had headaches throughout the past 2 weeks. Last HA reported was two days ago that last for one full day. Pt states she has a hx of gall stones. Pt reports she has been feeling very "shaky" since 2000 tonight.

## 2013-08-05 NOTE — ED Notes (Signed)
Presents with sternal chest pain and "bad indigestion" began at 8 pm. Pain is described as heaviness associated with nausea, SOB, feeling "not quite right." denies chest heaviness at this time, still reports feeling "not quite right"

## 2013-08-05 NOTE — ED Provider Notes (Signed)
CSN: [id number]     Arrival date & time 08/05/13  2226 History   First MD Initiated Contact with Patient 08/05/13 2349     Chief Complaint  Patient presents with  . Chest Pain   (Consider location/radiation/quality/duration/timing/severity/associated sxs/prior Treatment) HPI This note that this is a late entry.  This patient is a 52 year old woman who presents with epigastric pain. She has had intermittent epigastric pain with occasional radiation of pain into the midline region of the chest for the last 3 weeks. Her symptoms began after she completed a seven-day course of Cipro for a persistent urinary tract infection. She saw her primary care physician, initially, for evaluation of this symptom. 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 She was started on Nexium. She felt like this helped her pain for a few days. However, it recurred.  This evening, after a couple of days without pain, patient had recurrent pain. However, with this episode, she also had "uncontrollable shaking". This was noticed by the patient's husband and son as well. She denies fever. She has had nausea but no vomiting. No diarrhea. No genitourinary symptoms.  The patient denies shortness of breath and cough. She says that she has been to hold the past that she has gallstones. She has no history of coronary artery disease and has never had cardiac workup. She has no CAD risk factors.  She describes her pain as burning and refers to it as "indegestion". However, she has not noticed a correlation with po intake.      Past Medical History  Diagnosis Date  . Headache(784.0)   . GERD (gastroesophageal reflux disease)    Past Surgical History  Procedure Laterality Date  . Cesarean section      x2  . Appendectomy    . Cervical conization w/bx  06/03/2012    Procedure: CONIZATION CERVIX WITH BIOPSY;  Surgeon: Zelphia Cairo, MD;  Location: [facility] ORS;  Service: Gynecology;  Laterality: N/A;  . Hysteroscopy  w/d&c  06/03/2012    Procedure: DILATATION AND CURETTAGE /HYSTEROSCOPY;  Surgeon: Zelphia Cairo, MD;  Location: [facility] ORS;  Service: Gynecology;  Laterality: N/A;  . Colonoscopy  06/24/2012    Procedure: COLONOSCOPY;  Surgeon: Malissa Hippo, MD;  Location: [facility];  Service: Endoscopy;  Laterality: N/A;  1:20  . Ventral hernia repair  07/07/2012    Procedure: LAPAROSCOPIC VENTRAL HERNIA;  Surgeon: Kandis Cocking, MD;  Location: [facility] ORS;  Service: General;  Laterality: Left;  Laparoscopic Repair of Left Spigelian hernia  . Hernia repair     History reviewed. No pertinent family history. History  Substance Use Topics  . Smoking status: Never Smoker   . Smokeless tobacco: Not on file  . Alcohol Use: No   OB History   Grav Para Term Preterm Abortions TAB SAB Ect Mult Living                 Review of Systems  10 review of systems obtained is negative with the exception of symptoms noted above  Allergies  Codeine  Home Medications   Current Outpatient Rx  Name  Route  Sig  Dispense  Refill  . COMBIPATCH 0.05-0.25 MG/DAY   Transdermal   Place 1 patch onto the skin 2 (two) times a week. Tuesday and Saturday         . NEXIUM 20 MG capsule   Oral   Take 20 mg by mouth 2 (two) times daily before a meal.  BP 138/67  Pulse 90  Temp(Src) 98.3 F (36.8 C) (Oral)  Resp 17  Wt 199 lb 3 oz (90.351 kg)  SpO2 99% Physical Exam Gen: well developed and well nourished appearing Head: NCAT Eyes: PERL, EOMI Nose: no epistaixis or rhinorrhea Mouth/throat: mucosa is moist and pink Neck: supple, no stridor Lungs: CTA B, no wheezing, rhonchi or rales CV: Regular rate and rhythm, 2/6 systolic murmur, extremities well perfused Abd: soft, notender, nondistended, no peritoneal sign Back: no ttp, no cva ttp Skin: no rashese, wnl, wam and dry Neuro: CN ii-xii grossly intact, no focal deficits Psyche; normal affect,  calm and cooperative.  Ext: normal to inspection, nontender,  no edema  ED Course  Procedures (including critical care time) Labs Review Results for orders placed during the hospital encounter of 08/05/13 (from the past 24 hour(s))  GLUCOSE, CAPILLARY     Status: None   Collection Time    08/05/13 10:41 PM      Result Value Range   Glucose-Capillary 90  70 - 99 mg/dL  CBC     Status: Abnormal   Collection Time    08/05/13 11:08 PM      Result Value Range   WBC 15.9 (*) 4.0 - 10.5 K/uL   RBC 4.54  3.87 - 5.11 MIL/uL   Hemoglobin 14.1  12.0 - 15.0 g/dL   HCT 16.1  09.6 - 04.5 %   MCV 88.3  78.0 - 100.0 fL   MCH 31.1  26.0 - 34.0 pg   MCHC 35.2  30.0 - 36.0 g/dL   RDW 40.9  81.1 - 91.4 %   Platelets 307  150 - 400 K/uL  BASIC METABOLIC PANEL     Status: Abnormal   Collection Time    08/05/13 11:08 PM      Result Value Range   Sodium 141  135 - 145 mEq/L   Potassium 3.2 (*) 3.5 - 5.1 mEq/L   Chloride 105  96 - 112 mEq/L   CO2 21  19 - 32 mEq/L   Glucose, Bld 109 (*) 70 - 99 mg/dL   BUN 11  6 - 23 mg/dL   Creatinine, Ser 7.82  0.50 - 1.10 mg/dL   Calcium 9.7  8.4 - 95.6 mg/dL   GFR calc non Af Amer 73 (*) >90 mL/min   GFR calc Af Amer 84 (*) >90 mL/min  LIPASE, BLOOD     Status: None   Collection Time    08/05/13 11:08 PM      Result Value Range   Lipase 25  11 - 59 U/L  HEPATIC FUNCTION PANEL     Status: None   Collection Time    08/05/13 11:08 PM      Result Value Range   Total Protein 7.8  6.0 - 8.3 g/dL   Albumin 4.4  3.5 - 5.2 g/dL   AST 19  0 - 37 U/L   ALT 13  0 - 35 U/L   Alkaline Phosphatase 81  39 - 117 U/L   Total Bilirubin 0.3  0.3 - 1.2 mg/dL   Bilirubin, Direct <2.1  0.0 - 0.3 mg/dL   Indirect Bilirubin NOT CALCULATED  0.3 - 0.9 mg/dL  D-DIMER, QUANTITATIVE     Status: None   Collection Time    08/05/13 11:08 PM      Result Value Range   D-Dimer, Quant <0.27  0.00 - 0.48 ug/mL-FEU  POCT I-STAT TROPONIN I     Status: None  Collection Time    08/05/13 11:22 PM      Result Value Range   Troponin i, poc 0.00   0.00 - 0.08 ng/mL   Comment 3           LACTIC ACID, PLASMA     Status: None   Collection Time    08/06/13 12:40 AM      Result Value Range   Lactic Acid, Venous 0.7  0.5 - 2.2 mmol/L  URINALYSIS, ROUTINE W REFLEX MICROSCOPIC     Status: Abnormal   Collection Time    08/06/13 12:40 AM      Result Value Range   Color, Urine YELLOW  YELLOW   APPearance CLEAR  CLEAR   Specific Gravity, Urine 1.008  1.005 - 1.030   pH 5.5  5.0 - 8.0   Glucose, UA NEGATIVE  NEGATIVE mg/dL   Hgb urine dipstick TRACE (*) NEGATIVE   Bilirubin Urine NEGATIVE  NEGATIVE   Ketones, ur 40 (*) NEGATIVE mg/dL   Protein, ur NEGATIVE  NEGATIVE mg/dL   Urobilinogen, UA 0.2  0.0 - 1.0 mg/dL   Nitrite NEGATIVE  NEGATIVE   Leukocytes, UA NEGATIVE  NEGATIVE  URINE MICROSCOPIC-ADD ON     Status: None   Collection Time    08/06/13 12:40 AM      Result Value Range   Squamous Epithelial / LPF RARE  RARE   WBC, UA 3-6  <3 WBC/hpf   RBC / HPF 0-2  <3 RBC/hpf   Bacteria, UA RARE  RARE   Imaging Review Dg Chest 2 View  08/05/2013   CLINICAL DATA:  Chest pain.  EXAM: CHEST  2 VIEW  COMPARISON:  None.  FINDINGS: The heart size and mediastinal contours are within normal limits. Both lungs are clear. The visualized skeletal structures are unremarkable.  IMPRESSION: No active cardiopulmonary disease.   Electronically Signed   By: Roanna Raider M.D.   On: 08/05/2013 23:43    EKG Interpretation   None       MDM  Ddx: pancreatitis, biliary colic, gastritis, GERD, colitis, PE, pna, ptx, acs  0430:  Patient re-evaluated. Pain free. Resting comfortably. Awaiting ultrasound.   8657: Patient with 1.8cm stone lodged in neck of gallbladder. Pain has returned. Case disccussed with Dr. Derrell Lolling. His team will consult for admission. We will initiate maintenance fluids.   Brandt Loosen, MD 08/06/13 [phone]

## 2013-08-06 ENCOUNTER — Emergency Department (HOSPITAL_COMMUNITY): Payer: Commercial Indemnity

## 2013-08-06 ENCOUNTER — Encounter (HOSPITAL_COMMUNITY): Payer: Self-pay | Admitting: Emergency Medicine

## 2013-08-06 ENCOUNTER — Encounter (HOSPITAL_COMMUNITY)
Admission: EM | Disposition: A | Discharge status: Home / Self Care | Payer: Self-pay | Source: Home / Self Care | Attending: Emergency Medicine

## 2013-08-06 ENCOUNTER — Observation Stay (HOSPITAL_COMMUNITY): Payer: Commercial Indemnity | Admitting: *Deleted

## 2013-08-06 ENCOUNTER — Encounter (HOSPITAL_COMMUNITY): Payer: Commercial Indemnity | Admitting: *Deleted

## 2013-08-06 DIAGNOSIS — K8020 Calculus of gallbladder without cholecystitis without obstruction: Secondary | ICD-10-CM

## 2013-08-06 DIAGNOSIS — K824 Cholesterolosis of gallbladder: Secondary | ICD-10-CM

## 2013-08-06 DIAGNOSIS — K8010 Calculus of gallbladder with chronic cholecystitis without obstruction: Secondary | ICD-10-CM

## 2013-08-06 HISTORY — PX: CHOLECYSTECTOMY: SHX55

## 2013-08-06 LAB — URINE MICROSCOPIC-ADD ON

## 2013-08-06 LAB — HEPATIC FUNCTION PANEL
ALT: 13 U/L (ref 0–35)
AST: 19 U/L (ref 0–37)
Alkaline Phosphatase: 81 U/L (ref 39–117)
Bilirubin, Direct: <0.1 mg/dL (ref 0.0–0.3)
Total Bilirubin: 0.3 mg/dL (ref 0.3–1.2)
Total Protein: 7.8 g/dL (ref 6.0–8.3)

## 2013-08-06 LAB — URINALYSIS, ROUTINE W REFLEX MICROSCOPIC
Bilirubin Urine: NEGATIVE
Ketones, ur: 40 mg/dL — AB
Nitrite: NEGATIVE
Protein, ur: NEGATIVE mg/dL
Specific Gravity, Urine: 1.008 (ref 1.005–1.030)
Urobilinogen, UA: 0.2 mg/dL (ref 0.0–1.0)
pH: 5.5 (ref 5.0–8.0)

## 2013-08-06 LAB — POCT I-STAT TROPONIN I: Troponin i, poc: 0 ng/mL (ref 0.00–0.08)

## 2013-08-06 LAB — SURGICAL PCR SCREEN: Staphylococcus aureus: NEGATIVE

## 2013-08-06 LAB — LIPASE, BLOOD: Lipase: 25 U/L (ref 11–59)

## 2013-08-06 SURGERY — LAPAROSCOPIC CHOLECYSTECTOMY WITH INTRAOPERATIVE CHOLANGIOGRAM
Anesthesia: General | Wound class: Clean Contaminated

## 2013-08-06 MED ORDER — NEOSTIGMINE METHYLSULFATE 1 MG/ML IJ SOLN
INTRAMUSCULAR | Status: DC | PRN
  Administered 2013-08-06: 4 mg via INTRAVENOUS

## 2013-08-06 MED ORDER — BUPIVACAINE-EPINEPHRINE 0.25% -1:200000 IJ SOLN
INTRAMUSCULAR | Status: DC | PRN
  Administered 2013-08-06: 20 mL

## 2013-08-06 MED ORDER — HYDROMORPHONE HCL PF 1 MG/ML IJ SOLN: 1.0000 mg | INTRAMUSCULAR | Status: DC | PRN

## 2013-08-06 MED ORDER — DEXAMETHASONE SODIUM PHOSPHATE 4 MG/ML IJ SOLN
INTRAMUSCULAR | Status: DC | PRN
  Administered 2013-08-06: 8 mg via INTRAVENOUS

## 2013-08-06 MED ORDER — MORPHINE SULFATE 4 MG/ML IJ SOLN
4.0000 mg | Freq: Once | INTRAMUSCULAR | Status: AC
  Administered 2013-08-06: 4 mg via INTRAVENOUS
  Filled 2013-08-06: qty 1

## 2013-08-06 MED ORDER — GLYCOPYRROLATE 0.2 MG/ML IJ SOLN
INTRAMUSCULAR | Status: DC | PRN
  Administered 2013-08-06: 0.6 mg via INTRAVENOUS

## 2013-08-06 MED ORDER — LACTATED RINGERS IV SOLN
INTRAVENOUS | Status: DC
  Administered 2013-08-06: 11:00:00 via INTRAVENOUS

## 2013-08-06 MED ORDER — INFLUENZA VAC SPLIT QUAD 0.5 ML IM SUSP
0.5000 mL | INTRAMUSCULAR | Status: AC
  Administered 2013-08-06: 0.5 mL via INTRAMUSCULAR
  Filled 2013-08-06: qty 0.5

## 2013-08-06 MED ORDER — POTASSIUM CHLORIDE CRYS ER 20 MEQ PO TBCR
40.0000 meq | EXTENDED_RELEASE_TABLET | Freq: Once | ORAL | Status: AC
  Administered 2013-08-06: 40 meq via ORAL
  Filled 2013-08-06: qty 2

## 2013-08-06 MED ORDER — OXYCODONE-ACETAMINOPHEN 5-325 MG PO TABS
1.0000 | ORAL_TABLET | ORAL | Status: DC | PRN
  Administered 2013-08-06: 2 via ORAL
  Filled 2013-08-06: qty 2

## 2013-08-06 MED ORDER — PROPOFOL 10 MG/ML IV BOLUS
INTRAVENOUS | Status: DC | PRN
  Administered 2013-08-06: 200 mg via INTRAVENOUS

## 2013-08-06 MED ORDER — ONDANSETRON HCL 4 MG/2ML IJ SOLN
INTRAMUSCULAR | Status: DC | PRN
  Administered 2013-08-06: 4 mg via INTRAVENOUS

## 2013-08-06 MED ORDER — HYDROCODONE-ACETAMINOPHEN 5-325 MG PO TABS: 1.0000 | ORAL_TABLET | ORAL | Status: DC | PRN

## 2013-08-06 MED ORDER — HYDROMORPHONE HCL PF 1 MG/ML IJ SOLN
0.2500 mg | INTRAMUSCULAR | Status: DC | PRN
  Administered 2013-08-06 (×2): 0.5 mg via INTRAVENOUS

## 2013-08-06 MED ORDER — SODIUM CHLORIDE 0.9 % IR SOLN
Status: DC | PRN
  Administered 2013-08-06: 1

## 2013-08-06 MED ORDER — BUPIVACAINE-EPINEPHRINE PF 0.25-1:200000 % IJ SOLN
INTRAMUSCULAR | Status: AC
  Filled 2013-08-06: qty 30

## 2013-08-06 MED ORDER — ROCURONIUM BROMIDE 100 MG/10ML IV SOLN
INTRAVENOUS | Status: DC | PRN
  Administered 2013-08-06: 35 mg via INTRAVENOUS

## 2013-08-06 MED ORDER — CEFAZOLIN SODIUM-DEXTROSE 2-3 GM-% IV SOLR
2.0000 g | INTRAVENOUS | Status: AC
  Administered 2013-08-06: 2 g via INTRAVENOUS
  Filled 2013-08-06: qty 50

## 2013-08-06 MED ORDER — ONDANSETRON HCL 4 MG/2ML IJ SOLN: 4.0000 mg | Freq: Four times a day (QID) | INTRAMUSCULAR | Status: DC | PRN

## 2013-08-06 MED ORDER — LACTATED RINGERS IV SOLN
INTRAVENOUS | Status: DC | PRN
  Administered 2013-08-06 (×2): via INTRAVENOUS

## 2013-08-06 MED ORDER — MIDAZOLAM HCL 5 MG/5ML IJ SOLN
INTRAMUSCULAR | Status: DC | PRN
  Administered 2013-08-06: 2 mg via INTRAVENOUS

## 2013-08-06 MED ORDER — IOHEXOL 300 MG/ML  SOLN
100.0000 mL | Freq: Once | INTRAMUSCULAR | Status: AC | PRN
  Administered 2013-08-06: 100 mL via INTRAVENOUS

## 2013-08-06 MED ORDER — POTASSIUM CHLORIDE IN NACL 20-0.9 MEQ/L-% IV SOLN
INTRAVENOUS | Status: DC
  Administered 2013-08-06: 16:00:00 via INTRAVENOUS
  Filled 2013-08-06: qty 1000

## 2013-08-06 MED ORDER — SODIUM CHLORIDE 0.9 % IV BOLUS (SEPSIS)
1000.0000 mL | Freq: Once | INTRAVENOUS | Status: AC
  Administered 2013-08-06: 1000 mL via INTRAVENOUS

## 2013-08-06 MED ORDER — OXYCODONE HCL 5 MG PO TABS: 5.0000 mg | ORAL_TABLET | Freq: Once | ORAL | Status: DC | PRN

## 2013-08-06 MED ORDER — ENOXAPARIN SODIUM 40 MG/0.4ML ~~LOC~~ SOLN: 40.0000 mg | SUBCUTANEOUS | Status: DC

## 2013-08-06 MED ORDER — FAMOTIDINE IN NACL 20-0.9 MG/50ML-% IV SOLN
20.0000 mg | Freq: Once | INTRAVENOUS | Status: AC
  Administered 2013-08-06: 20 mg via INTRAVENOUS
  Filled 2013-08-06: qty 50

## 2013-08-06 MED ORDER — INFLUENZA VAC SPLIT QUAD 0.5 ML IM SUSP: 0.5000 mL | INTRAMUSCULAR | Status: DC

## 2013-08-06 MED ORDER — ONDANSETRON HCL 4 MG/2ML IJ SOLN
4.0000 mg | Freq: Once | INTRAMUSCULAR | Status: AC
Start: 2013-08-06 — End: 2013-08-06
  Administered 2013-08-06: 4 mg via INTRAVENOUS
  Filled 2013-08-06: qty 2

## 2013-08-06 MED ORDER — IOHEXOL 300 MG/ML  SOLN
25.0000 mL | Freq: Once | INTRAMUSCULAR | Status: AC | PRN
  Administered 2013-08-06: 25 mL via ORAL

## 2013-08-06 MED ORDER — POTASSIUM CHLORIDE IN NACL 20-0.9 MEQ/L-% IV SOLN
INTRAVENOUS | Status: DC
  Filled 2013-08-06 (×3): qty 1000

## 2013-08-06 MED ORDER — OXYCODONE HCL 5 MG/5ML PO SOLN: 5.0000 mg | Freq: Once | ORAL | Status: DC | PRN

## 2013-08-06 MED ORDER — ONDANSETRON HCL 4 MG/2ML IJ SOLN
4.0000 mg | Freq: Once | INTRAMUSCULAR | Status: AC | PRN
  Administered 2013-08-06: 4 mg via INTRAVENOUS

## 2013-08-06 MED ORDER — KETOROLAC TROMETHAMINE 30 MG/ML IJ SOLN
INTRAMUSCULAR | Status: DC | PRN
  Administered 2013-08-06: 30 mg via INTRAVENOUS

## 2013-08-06 MED ORDER — ONDANSETRON HCL 4 MG/2ML IJ SOLN
4.0000 mg | Freq: Once | INTRAMUSCULAR | Status: AC
  Administered 2013-08-06: 4 mg via INTRAVENOUS

## 2013-08-06 MED ORDER — LIDOCAINE HCL (CARDIAC) 20 MG/ML IV SOLN
INTRAVENOUS | Status: DC | PRN
  Administered 2013-08-06: 80 mg via INTRAVENOUS

## 2013-08-06 MED ORDER — SODIUM CHLORIDE 0.9 % IV SOLN
3.0000 g | Freq: Once | INTRAVENOUS | Status: AC
  Administered 2013-08-06: 3 g via INTRAVENOUS
  Filled 2013-08-06: qty 3

## 2013-08-06 MED ORDER — KCL IN DEXTROSE-NACL 20-5-0.45 MEQ/L-%-% IV SOLN
Freq: Once | INTRAVENOUS | Status: AC
  Administered 2013-08-06: 08:00:00 via INTRAVENOUS
  Filled 2013-08-06: qty 1000

## 2013-08-06 MED ORDER — ONDANSETRON HCL 4 MG/2ML IJ SOLN
INTRAMUSCULAR | Status: AC
  Filled 2013-08-06: qty 2

## 2013-08-06 MED ORDER — FENTANYL CITRATE 0.05 MG/ML IJ SOLN
INTRAMUSCULAR | Status: DC | PRN
  Administered 2013-08-06 (×2): 100 ug via INTRAVENOUS
  Administered 2013-08-06: 50 ug via INTRAVENOUS
  Administered 2013-08-06: 100 ug via INTRAVENOUS

## 2013-08-06 MED ORDER — HYDROMORPHONE HCL PF 1 MG/ML IJ SOLN
INTRAMUSCULAR | Status: AC
  Filled 2013-08-06: qty 1

## 2013-08-06 SURGICAL SUPPLY — 33 items
APPLIER CLIP 5 13 M/L LIGAMAX5 (MISCELLANEOUS) ×2
BANDAGE ADHESIVE 1X3 (GAUZE/BANDAGES/DRESSINGS) ×2 IMPLANT
BENZOIN TINCTURE PRP APPL 2/3 (GAUZE/BANDAGES/DRESSINGS) ×2 IMPLANT
CANISTER SUCTION 2500CC (MISCELLANEOUS) ×2 IMPLANT
CHLORAPREP W/TINT 26ML (MISCELLANEOUS) ×2 IMPLANT
CLIP APPLIE 5 13 M/L LIGAMAX5 (MISCELLANEOUS) ×1 IMPLANT
CLSR STERI-STRIP ANTIMIC 1/2X4 (GAUZE/BANDAGES/DRESSINGS) ×2 IMPLANT
COVER MAYO STAND STRL (DRAPES) IMPLANT
COVER SURGICAL LIGHT HANDLE (MISCELLANEOUS) ×2 IMPLANT
DECANTER SPIKE VIAL GLASS SM (MISCELLANEOUS) ×2 IMPLANT
DRAPE C-ARM 42X72 X-RAY (DRAPES) IMPLANT
ELECT REM PT RETURN 9FT ADLT (ELECTROSURGICAL) ×2
ELECTRODE REM PT RTRN 9FT ADLT (ELECTROSURGICAL) ×1 IMPLANT
GLOVE SURG SIGNA 7.5 PF LTX (GLOVE) ×2 IMPLANT
GOWN STRL NON-REIN LRG LVL3 (GOWN DISPOSABLE) ×6 IMPLANT
GOWN STRL REIN XL XLG (GOWN DISPOSABLE) ×2 IMPLANT
KIT BASIN OR (CUSTOM PROCEDURE TRAY) ×2 IMPLANT
KIT ROOM TURNOVER OR (KITS) ×2 IMPLANT
NS IRRIG 1000ML POUR BTL (IV SOLUTION) ×2 IMPLANT
PAD ARMBOARD 7.5X6 YLW CONV (MISCELLANEOUS) ×2 IMPLANT
PENCIL BUTTON BLDE SNGL 10FT (ELECTRODE) ×2 IMPLANT
POUCH SPECIMEN RETRIEVAL 10MM (ENDOMECHANICALS) ×2 IMPLANT
SCISSORS LAP 5X35 DISP (ENDOMECHANICALS) IMPLANT
SET CHOLANGIOGRAPH 5 50 .035 (SET/KITS/TRAYS/PACK) IMPLANT
SET IRRIG TUBING LAPAROSCOPIC (IRRIGATION / IRRIGATOR) ×2 IMPLANT
SLEEVE ENDOPATH XCEL 5M (ENDOMECHANICALS) ×4 IMPLANT
SPECIMEN JAR SMALL (MISCELLANEOUS) ×2 IMPLANT
SUT MON AB 4-0 PC3 18 (SUTURE) ×2 IMPLANT
TOWEL OR 17X24 6PK STRL BLUE (TOWEL DISPOSABLE) ×2 IMPLANT
TOWEL OR 17X26 10 PK STRL BLUE (TOWEL DISPOSABLE) ×2 IMPLANT
TRAY LAPAROSCOPIC (CUSTOM PROCEDURE TRAY) ×2 IMPLANT
TROCAR XCEL BLUNT TIP 100MML (ENDOMECHANICALS) ×2 IMPLANT
TROCAR XCEL NON-BLD 5MMX100MML (ENDOMECHANICALS) ×2 IMPLANT

## 2013-08-06 NOTE — Anesthesia Preprocedure Evaluation (Signed)
Anesthesia Evaluation  Patient identified by MRN, date of birth, ID band Patient awake    Airway Mallampati: I TM Distance: >3 FB     Dental  (+) Teeth Intact and Dental Advisory Given   Pulmonary  breath sounds clear to auscultation        Cardiovascular Rate:Normal     Neuro/Psych    GI/Hepatic GERD-  Medicated and Controlled,  Endo/Other    Renal/GU      Musculoskeletal   Abdominal   Peds  Hematology   Anesthesia Other Findings   Reproductive/Obstetrics                           Anesthesia Physical Anesthesia Plan  ASA: II  Anesthesia Plan: General   Post-op Pain Management:    Induction: Intravenous  Airway Management Planned: Oral ETT  Additional Equipment:   Intra-op Plan:   Post-operative Plan: Extubation in OR  Informed Consent: I have reviewed the patients History and Physical, chart, labs and discussed the procedure including the risks, benefits and alternatives for the proposed anesthesia with the patient or authorized representative who has indicated his/her understanding and acceptance.   Dental advisory given  Plan Discussed with: CRNA, Anesthesiologist and Surgeon  Anesthesia Plan Comments:         Anesthesia Quick Evaluation

## 2013-08-06 NOTE — Preoperative (Signed)
Beta Blockers   Reason not to administer Beta Blockers:Not Applicable 

## 2013-08-06 NOTE — Discharge Summary (Signed)
Physician Discharge Summary  Patient ID: Marisa Mitchell MRN: [id number] DOB/AGE: 04-11-1961 52 y.o.  Admit date: 08/05/2013 Discharge date: 08/06/2013  Admission Diagnoses:  Discharge Diagnoses:  Active Problems:   * No active hospital problems. * acute cholecystitis with cholelithiasis Umbilical hernia  Discharged Condition: good  Hospital Course: uneventful post op course  Consults: None  Significant Diagnostic Studies:   Treatments: surgery: lap chole and umbilical hernia repair  Discharge Exam: Blood pressure 140/72, pulse 72, temperature 98.6 F (37 C), temperature source Oral, resp. rate 13, height 5\' 7"  (1.702 m), weight 199 lb 3 oz (90.351 kg), SpO2 99.00%. General appearance: alert and no distress Resp: clear to auscultation bilaterally Incision/Wound: clean  Disposition: 01-Home or Self Care     Medication List         COMBIPATCH 0.05-0.25 MG/DAY  Generic drug:  estradiol-norethindrone  Place 1 patch onto the skin 2 (two) times a week. Tuesday and Saturday     HYDROcodone-acetaminophen 5-325 MG per tablet  Commonly known as:  NORCO  Take 1-2 tablets by mouth every 4 (four) hours as needed.     NEXIUM 20 MG capsule  Generic drug:  esomeprazole  Take 20 mg by mouth 2 (two) times daily before a meal.           Follow-up Information   Follow up with [facility], MD. Call in 2 weeks.   Specialty:  General Surgery   Contact information:   [address] [place] [address] [phone]       Signed: Shelly Rubenstein 08/06/2013, 2:06 PM

## 2013-08-06 NOTE — H&P (Signed)
Marisa Mitchell is an 52 y.o. female.   Chief Complaint: Epigastric abdominal pain HPI: This is a 52 year old female who has had intermittent abdominal discomfort for the last 3 weeks. It has been mostly epigastric hurting into the chest and through to the back. She has had no nausea or vomiting. She did develop shaking chills last evening and presented to the emergency department. After medications, she is feeling better. The pain was moderate in intensity and described as sharp.  Past Medical History  Diagnosis Date  . Headache(784.0)   . GERD (gastroesophageal reflux disease)     Past Surgical History  Procedure Laterality Date  . Cesarean section      x2  . Appendectomy    . Cervical conization w/bx  06/03/2012    Procedure: CONIZATION CERVIX WITH BIOPSY;  Surgeon: Zelphia Cairo, MD;  Location: [facility];  Service: Gynecology;  Laterality: N/A;  . Hysteroscopy w/d&c  06/03/2012    Procedure: DILATATION AND CURETTAGE /HYSTEROSCOPY;  Surgeon: Zelphia Cairo, MD;  Location: [facility];  Service: Gynecology;  Laterality: N/A;  . Colonoscopy  06/24/2012    Procedure: COLONOSCOPY;  Surgeon: Malissa Hippo, MD;  Location: [facility];  Service: Endoscopy;  Laterality: N/A;  1:20  . Ventral hernia repair  07/07/2012    Procedure: LAPAROSCOPIC VENTRAL HERNIA;  Surgeon: Kandis Cocking, MD;  Location: [facility];  Service: General;  Laterality: Left;  Laparoscopic Repair of Left Spigelian hernia  . Hernia repair      History reviewed. No pertinent family history. Social History:  reports that she has never smoked. She does not have any smokeless tobacco history on file. She reports that she does not drink alcohol or use illicit drugs.  Allergies:  Allergies  Allergen Reactions  . Codeine Other (See Comments)    Stomach Cramps     (Not in a hospital admission)  Results for orders placed during the hospital encounter of 08/05/13 (from the past 48 hour(s))  GLUCOSE, CAPILLARY     Status: None    Collection Time    08/05/13 10:41 PM      Result Value Range   Glucose-Capillary 90  70 - 99 mg/dL  CBC     Status: Abnormal   Collection Time    08/05/13 11:08 PM      Result Value Range   WBC 15.9 (*) 4.0 - 10.5 K/uL   RBC 4.54  3.87 - 5.11 MIL/uL   Hemoglobin 14.1  12.0 - 15.0 g/dL   HCT 40.9  81.1 - 91.4 %   MCV 88.3  78.0 - 100.0 fL   MCH 31.1  26.0 - 34.0 pg   MCHC 35.2  30.0 - 36.0 g/dL   RDW 78.2  95.6 - 21.3 %   Platelets 307  150 - 400 K/uL  BASIC METABOLIC PANEL     Status: Abnormal   Collection Time    08/05/13 11:08 PM      Result Value Range   Sodium 141  135 - 145 mEq/L   Potassium 3.2 (*) 3.5 - 5.1 mEq/L   Chloride 105  96 - 112 mEq/L   CO2 21  19 - 32 mEq/L   Glucose, Bld 109 (*) 70 - 99 mg/dL   BUN 11  6 - 23 mg/dL   Creatinine, Ser 0.86  0.50 - 1.10 mg/dL   Calcium 9.7  8.4 - 57.8 mg/dL   GFR calc non Af Amer 73 (*) >90 mL/min   GFR calc  Af Amer 84 (*) >90 mL/min   Comment: (NOTE)     The eGFR has been calculated using the CKD EPI equation.     This calculation has not been validated in all clinical situations.     eGFR's persistently <90 mL/min signify possible Chronic Kidney     Disease.  LIPASE, BLOOD     Status: None   Collection Time    08/05/13 11:08 PM      Result Value Range   Lipase 25  11 - 59 U/L  HEPATIC FUNCTION PANEL     Status: None   Collection Time    08/05/13 11:08 PM      Result Value Range   Total Protein 7.8  6.0 - 8.3 g/dL   Albumin 4.4  3.5 - 5.2 g/dL   AST 19  0 - 37 U/L   ALT 13  0 - 35 U/L   Alkaline Phosphatase 81  39 - 117 U/L   Total Bilirubin 0.3  0.3 - 1.2 mg/dL   Bilirubin, Direct <1.6  0.0 - 0.3 mg/dL   Indirect Bilirubin NOT CALCULATED  0.3 - 0.9 mg/dL  D-DIMER, QUANTITATIVE     Status: None   Collection Time    08/05/13 11:08 PM      Result Value Range   D-Dimer, Quant <0.27  0.00 - 0.48 ug/mL-FEU   Comment:            AT THE INHOUSE ESTABLISHED CUTOFF     VALUE OF 0.48 ug/mL FEU,     THIS ASSAY HAS  BEEN DOCUMENTED     IN THE LITERATURE TO HAVE     A SENSITIVITY AND NEGATIVE     PREDICTIVE VALUE OF AT LEAST     98 TO 99%.  THE TEST RESULT     SHOULD BE CORRELATED WITH     AN ASSESSMENT OF THE CLINICAL     PROBABILITY OF DVT / VTE.  POCT I-STAT TROPONIN I     Status: None   Collection Time    08/05/13 11:22 PM      Result Value Range   Troponin i, poc 0.00  0.00 - 0.08 ng/mL   Comment 3            Comment: Due to the release kinetics of cTnI,     a negative result within the first hours     of the onset of symptoms does not rule out     myocardial infarction with certainty.     If myocardial infarction is still suspected,     repeat the test at appropriate intervals.  LACTIC ACID, PLASMA     Status: None   Collection Time    08/06/13 12:40 AM      Result Value Range   Lactic Acid, Venous 0.7  0.5 - 2.2 mmol/L  URINALYSIS, ROUTINE W REFLEX MICROSCOPIC     Status: Abnormal   Collection Time    08/06/13 12:40 AM      Result Value Range   Color, Urine YELLOW  YELLOW   APPearance CLEAR  CLEAR   Specific Gravity, Urine 1.008  1.005 - 1.030   pH 5.5  5.0 - 8.0   Glucose, UA NEGATIVE  NEGATIVE mg/dL   Hgb urine dipstick TRACE (*) NEGATIVE   Bilirubin Urine NEGATIVE  NEGATIVE   Ketones, ur 40 (*) NEGATIVE mg/dL   Protein, ur NEGATIVE  NEGATIVE mg/dL   Urobilinogen, UA 0.2  0.0 - 1.0 mg/dL  Nitrite NEGATIVE  NEGATIVE   Leukocytes, UA NEGATIVE  NEGATIVE  URINE MICROSCOPIC-ADD ON     Status: None   Collection Time    08/06/13 12:40 AM      Result Value Range   Squamous Epithelial / LPF RARE  RARE   WBC, UA 3-6  <3 WBC/hpf   RBC / HPF 0-2  <3 RBC/hpf   Bacteria, UA RARE  RARE  POCT I-STAT TROPONIN I     Status: None   Collection Time    08/06/13  4:17 AM      Result Value Range   Troponin i, poc 0.00  0.00 - 0.08 ng/mL   Comment 3            Comment: Due to the release kinetics of cTnI,     a negative result within the first hours     of the onset of symptoms does not  rule out     myocardial infarction with certainty.     If myocardial infarction is still suspected,     repeat the test at appropriate intervals.   Dg Chest 2 View  08/05/2013   CLINICAL DATA:  Chest pain.  EXAM: CHEST  2 VIEW  COMPARISON:  None.  FINDINGS: The heart size and mediastinal contours are within normal limits. Both lungs are clear. The visualized skeletal structures are unremarkable.  IMPRESSION: No active cardiopulmonary disease.   Electronically Signed   By: Roanna Raider M.D.   On: 08/05/2013 23:43   US Abdomen Complete  08/06/2013   CLINICAL DATA:  Abdominal pain.  EXAM: ULTRASOUND ABDOMEN COMPLETE  COMPARISON:  None.  FINDINGS: Gallbladder  A 1.8 cm stone is noted lodged at the neck of the gallbladder. The gallbladder is otherwise unremarkable in appearance. No gallbladder wall thickening or pericholecystic fluid is seen. No ultrasonographic Murphy's sign is elicited.  Common bile duct  Diameter: 0.6 cm; within normal limits in caliber.  Liver  No focal lesion identified. Within normal limits in parenchymal echogenicity.  IVC  No abnormality visualized.  Pancreas  Not visualized due to overlying bowel gas.  Spleen  Size and appearance within normal limits.  Right Kidney  Length: 10.1 cm. Echogenicity within normal limits. No mass or hydronephrosis visualized.  Left Kidney  Length: 10.8 cm. Echogenicity within normal limits. No mass or hydronephrosis visualized.  Abdominal aorta  No aneurysm visualized. Not characterized proximally due to overlying bowel gas.  IMPRESSION: 1. No acute abnormality seen within the abdomen. 2. 1.8 cm stone lodged at the neck of the gallbladder; gallbladder otherwise unremarkable in appearance. No evidence for obstruction or cholecystitis.   Electronically Signed   By: Roanna Raider M.D.   On: 08/06/2013 05:45   Ct Abdomen Pelvis W Contrast  08/06/2013   CLINICAL DATA:  Epigastric abdominal pain, back pain, nausea and constipation.  EXAM: CT ABDOMEN AND  PELVIS WITH CONTRAST  TECHNIQUE: Multidetector CT imaging of the abdomen and pelvis was performed using the standard protocol following bolus administration of intravenous contrast.  CONTRAST:  OMNIPAQUE IOHEXOL 300 MG/ML  SOLN  COMPARISON:  CT of the abdomen and pelvis performed 05/25/2012  FINDINGS: Minimal bibasilar atelectasis is noted. A tiny hiatal hernia is seen.  The liver and spleen are unremarkable in appearance. The gallbladder is within normal limits. The pancreas and adrenal glands are unremarkable.  The kidneys are unremarkable in appearance. There is no evidence of hydronephrosis. No renal or ureteral stones are seen. No perinephric stranding is appreciated.  No free fluid is identified. The small bowel is unremarkable in appearance. The stomach is within normal limits. No acute vascular abnormalities are seen. A tiny umbilical hernia is noted, containing only fat.  The patient is status post appendectomy. The colon is unremarkable in appearance.  The bladder is moderately distended and grossly unremarkable in appearance. The uterus is within normal limits. The ovaries are relatively symmetric; no suspicious adnexal masses are seen. No inguinal lymphadenopathy is seen.  No acute osseous abnormalities are identified.  IMPRESSION: 1. No acute abnormalities in the abdomen or pelvis. 2. Tiny umbilical hernia, containing only fat. 3. Tiny hiatal hernia seen.   Electronically Signed   By: Roanna Raider M.D.   On: 08/06/2013 03:03    Review of Systems  All other systems reviewed and are negative.    Blood pressure 121/67, pulse 68, temperature 98.3 F (36.8 C), temperature source Oral, resp. rate 15, weight 199 lb 3 oz (90.351 kg), SpO2 94.00%. Physical Exam  Constitutional: She is oriented to person, place, and time. She appears well-developed and well-nourished. No distress.  HENT:  Head: Normocephalic and atraumatic.  Right Ear: External ear normal.  Left Ear: External ear normal.   Nose: Nose normal.  Mouth/Throat: Oropharynx is clear and moist. No oropharyngeal exudate.  Eyes: Conjunctivae are normal. Pupils are equal, round, and reactive to light. Right eye exhibits no discharge. Left eye exhibits no discharge. No scleral icterus.  Neck: Normal range of motion. Neck supple. No tracheal deviation present. No thyromegaly present.  Cardiovascular: Normal rate, regular rhythm, normal heart sounds and intact distal pulses.   No murmur heard. Respiratory: Effort normal and breath sounds normal. No respiratory distress. She has no wheezes.  GI: Soft. Bowel sounds are normal. There is tenderness.  There is mild tenderness with guarding in the right upper quadrant and epigastrium  Musculoskeletal: Normal range of motion. She exhibits no edema and no tenderness.  Lymphadenopathy:    She has no cervical adenopathy.  Neurological: She is alert and oriented to person, place, and time.  Skin: Skin is warm and dry. No rash noted. She is not diaphoretic. No erythema.  Psychiatric: Her behavior is normal. Judgment normal.     Assessment/Plan Cholecystitis with impacted gallstone in the gallbladder neck  Laparoscopic cholecystectomy is recommended. I discussed this with the patient in detail. I discussed the risk of surgery which includes but is not limited to bleeding, infection, injury to surrounding structures, Bile leak, bile duct injury, the need to convert to an open procedure, et Karie Soda. I also discussed postoperative recovery. She understands and wishes to proceed. She will be admitted for planned surgery later today  Corney Knighton A 08/06/2013, 7:41 AM

## 2013-08-06 NOTE — Anesthesia Postprocedure Evaluation (Signed)
  Anesthesia Post-op Note  Patient: Marisa Mitchell  Procedure(s) Performed: Procedure(s): LAPAROSCOPIC CHOLECYSTECTOMY WITH UMBILICAL HERNIA REPAIR (N/A)  Patient Location: PACU  Anesthesia Type:General  Level of Consciousness: awake, alert  and oriented  Airway and Oxygen Therapy: Patient Spontanous Breathing and Patient connected to nasal cannula oxygen  Post-op Pain: mild  Post-op Assessment: Post-op Vital signs reviewed  Post-op Vital Signs: Reviewed  Complications: No apparent anesthesia complications

## 2013-08-06 NOTE — Op Note (Signed)
Laparoscopic Cholecystectomy Procedure Note  Indications: This patient presents with symptomatic gallbladder disease and will undergo laparoscopic cholecystectomy.  Pre-operative Diagnosis: Calculus of gallbladder with acute cholecystitis, without mention of obstruction                                               Umbilical herina  Post-operative Diagnosis: Same  Surgeon: Abigail Miyamoto A   Assistants: 0  Anesthesia: General endotracheal anesthesia  ASA Class: 2  Procedure Details  The patient was seen again in the Holding Room. The risks, benefits, complications, treatment options, and expected outcomes were discussed with the patient. The possibilities of reaction to medication, pulmonary aspiration, perforation of viscus, bleeding, recurrent infection, finding a normal gallbladder, the need for additional procedures, failure to diagnose a condition, the possible need to convert to an open procedure, and creating a complication requiring transfusion or operation were discussed with the patient. The likelihood of improving the patient's symptoms with return to their baseline status is good.  The patient and/or family concurred with the proposed plan, giving informed consent. The site of surgery properly noted. The patient was taken to Operating Room, identified as Albesa Seen and the procedure verified as Laparoscopic Cholecystectomy with Intraoperative Cholangiogram. A Time Out was held and the above information confirmed.  Prior to the induction of general anesthesia, antibiotic prophylaxis was administered. General endotracheal anesthesia was then administered and tolerated well. After the induction, the abdomen was prepped with Chloraprep and draped in sterile fashion. The patient was positioned in the supine position.  Local anesthetic agent was injected into the skin near the umbilicus and an incision made. We dissected down to the abdominal fascia with blunt dissection.  The  fascia was incised vertically and we entered the peritoneal cavity bluntly.  A pursestring suture of 0-Vicryl was placed around the fascial opening.  The Hasson cannula was inserted and secured with the stay suture.  Pneumoperitoneum was then created with CO2 and tolerated well without any adverse changes in the patient's vital signs. An 11-mm port was placed in the subxiphoid position.  Two 5-mm ports were placed in the right upper quadrant. All skin incisions were infiltrated with a local anesthetic agent before making the incision and placing the trocars.   We positioned the patient in reverse Trendelenburg, tilted slightly to the patient's left.  The gallbladder was identified, the fundus grasped and retracted cephalad. Adhesions were lysed bluntly and with the electrocautery where indicated, taking care not to injure any adjacent organs or viscus. The infundibulum was grasped and retracted laterally, exposing the peritoneum overlying the triangle of Calot. This was then divided and exposed in a blunt fashion. The cystic duct was clearly identified and bluntly dissected circumferentially. A critical view of the cystic duct and cystic artery was obtained.  The cystic duct was then ligated with clips and divided. The cystic artery was, dissected free, ligated with clips and divided as well.   The gallbladder was dissected from the liver bed in retrograde fashion with the electrocautery. The gallbladder was removed and placed in an Endocatch sac. The liver bed was irrigated and inspected. Hemostasis was achieved with the electrocautery. Copious irrigation was utilized and was repeatedly aspirated until clear.  The gallbladder and Endocatch sac were then removed through the umbilical port site.  The pursestring suture was used to close the umbilical fascia.  I  then used a 1.0 prolene suture to repair the small umbilical hernia defect  We again inspected the right upper quadrant for hemostasis.   Pneumoperitoneum was released as we removed the trocars.  4-0 Monocryl was used to close the skin.   Benzoin, steri-strips, and clean dressings were applied. The patient was then extubated and brought to the recovery room in stable condition. Instrument, sponge, and needle counts were correct at closure and at the conclusion of the case.   Findings: Cholecystitis with Cholelithiasis  Estimated Blood Loss: Minimal         Drains: 0         Specimens: Gallbladder           Complications: None; patient tolerated the procedure well.         Disposition: PACU - hemodynamically stable.         Condition: stable

## 2013-08-06 NOTE — Transfer of Care (Signed)
Immediate Anesthesia Transfer of Care Note  Patient: Marisa Mitchell  Procedure(s) Performed: Procedure(s): LAPAROSCOPIC CHOLECYSTECTOMY WITH UMBILICAL HERNIA REPAIR (N/A)  Patient Location: PACU  Anesthesia Type:General  Level of Consciousness: patient cooperative and responds to stimulation  Airway & Oxygen Therapy: Patient Spontanous Breathing and Patient connected to nasal cannula oxygen  Post-op Assessment: Report given to PACU RN and Post -op Vital signs reviewed and stable  Post vital signs: Reviewed and stable  Complications: No apparent anesthesia complications

## 2013-08-06 NOTE — ED Notes (Signed)
CT called and informed pt finished contrast drink.  

## 2013-08-06 NOTE — ED Notes (Signed)
Surgeon was into speak with pt.

## 2013-08-07 NOTE — Progress Notes (Signed)
Patient discharged from hospital at 2048. Discharge instructions given with voiced understanding.

## 2013-08-08 ENCOUNTER — Emergency Department (HOSPITAL_COMMUNITY)
Admission: EM | Admit: 2013-08-08 | Discharge: 2013-08-08 | Disposition: A | Discharge status: Home / Self Care | Payer: Commercial Indemnity | Attending: Emergency Medicine | Admitting: Emergency Medicine

## 2013-08-08 ENCOUNTER — Emergency Department (HOSPITAL_COMMUNITY): Payer: Commercial Indemnity

## 2013-08-08 ENCOUNTER — Encounter (HOSPITAL_COMMUNITY): Payer: Self-pay | Admitting: Emergency Medicine

## 2013-08-08 DIAGNOSIS — K219 Gastro-esophageal reflux disease without esophagitis: Secondary | ICD-10-CM | POA: Insufficient documentation

## 2013-08-08 DIAGNOSIS — R000 Tachycardia, unspecified: Secondary | ICD-10-CM | POA: Insufficient documentation

## 2013-08-08 DIAGNOSIS — R072 Precordial pain: Secondary | ICD-10-CM | POA: Insufficient documentation

## 2013-08-08 DIAGNOSIS — R109 Unspecified abdominal pain: Secondary | ICD-10-CM | POA: Insufficient documentation

## 2013-08-08 DIAGNOSIS — R079 Chest pain, unspecified: Secondary | ICD-10-CM

## 2013-08-08 DIAGNOSIS — Z79899 Other long term (current) drug therapy: Secondary | ICD-10-CM | POA: Insufficient documentation

## 2013-08-08 DIAGNOSIS — R0602 Shortness of breath: Secondary | ICD-10-CM | POA: Insufficient documentation

## 2013-08-08 DIAGNOSIS — Z9889 Other specified postprocedural states: Secondary | ICD-10-CM | POA: Insufficient documentation

## 2013-08-08 LAB — POCT I-STAT TROPONIN I: Troponin i, poc: 0 ng/mL (ref 0.00–0.08)

## 2013-08-08 LAB — HEPATIC FUNCTION PANEL
ALT: 25 U/L (ref 0–35)
AST: 25 U/L (ref 0–37)
Albumin: 4.2 g/dL (ref 3.5–5.2)
Alkaline Phosphatase: 78 U/L (ref 39–117)
Bilirubin, Direct: <0.1 mg/dL (ref 0.0–0.3)
Total Bilirubin: 0.3 mg/dL (ref 0.3–1.2)
Total Protein: 8.2 g/dL (ref 6.0–8.3)

## 2013-08-08 LAB — CBC
HCT: 41.2 % (ref 36.0–46.0)
MCH: 31.2 pg (ref 26.0–34.0)
MCV: 89.8 fL (ref 78.0–100.0)
RBC: 4.59 MIL/uL (ref 3.87–5.11)
RDW: 12.6 % (ref 11.5–15.5)
WBC: 15.1 10*3/uL — ABNORMAL HIGH (ref 4.0–10.5)

## 2013-08-08 LAB — BASIC METABOLIC PANEL
BUN: 7 mg/dL (ref 6–23)
CO2: 25 mEq/L (ref 19–32)
Chloride: 100 mEq/L (ref 96–112)
Creatinine, Ser: 0.75 mg/dL (ref 0.50–1.10)
Glucose, Bld: 99 mg/dL (ref 70–99)

## 2013-08-08 LAB — LIPASE, BLOOD: Lipase: 16 U/L (ref 11–59)

## 2013-08-08 MED ORDER — IOHEXOL 350 MG/ML SOLN
80.0000 mL | Freq: Once | INTRAVENOUS | Status: AC | PRN
  Administered 2013-08-08: 80 mL via INTRAVENOUS

## 2013-08-08 MED ORDER — SODIUM CHLORIDE 0.9 % IV BOLUS (SEPSIS)
500.0000 mL | Freq: Once | INTRAVENOUS | Status: AC
  Administered 2013-08-08: 500 mL via INTRAVENOUS

## 2013-08-08 MED ORDER — TRAMADOL HCL 50 MG PO TABS: 50.0000 mg | ORAL_TABLET | Freq: Four times a day (QID) | ORAL | Status: AC | PRN

## 2013-08-08 MED ORDER — OMEPRAZOLE 20 MG PO CPDR: 20.0000 mg | DELAYED_RELEASE_CAPSULE | Freq: Every day | ORAL | Status: AC

## 2013-08-08 NOTE — ED Notes (Signed)
Pt had gall bladder removed on Saturday and now having CP and SOB; pt sent for eval for possible EKG changes

## 2013-08-08 NOTE — ED Notes (Signed)
Dr. Pickering at the bedside.  

## 2013-08-08 NOTE — ED Provider Notes (Signed)
CSN: [id number]     Arrival date & time 08/08/13  1506 History   First MD Initiated Contact with Patient 08/08/13 1603     Chief Complaint  Patient presents with  . Chest Pain   (Consider location/radiation/quality/duration/timing/severity/associated sxs/prior Treatment) Patient is a 52 y.o. female presenting with chest pain. The history is provided by the patient.  Chest Pain Pain location:  Substernal area Associated symptoms: shortness of breath   Associated symptoms: no abdominal pain, no back pain, no headache, no nausea, no numbness, not vomiting and no weakness    patient had a recent laparoscopic cholecystectomy and umbilical hernia repair. She was discharged from the hospital 2 days ago. Before the surgery she was having some pain in her lower chest. She states she is having a similar pain now, but is also having some difficulty breathing. She saw her primary care Dr. who told her to come to the ER. No fevers. No cough. She states her abdomen is feeling better. No hemoptysis. No known heart history. No diaphoresis.  Past Medical History  Diagnosis Date  . Headache(784.0)   . GERD (gastroesophageal reflux disease)    Past Surgical History  Procedure Laterality Date  . Cesarean section      x2  . Appendectomy    . Cervical conization w/bx  06/03/2012    Procedure: CONIZATION CERVIX WITH BIOPSY;  Surgeon: Zelphia Cairo, MD;  Location: [facility];  Service: Gynecology;  Laterality: N/A;  . Hysteroscopy w/d&c  06/03/2012    Procedure: DILATATION AND CURETTAGE /HYSTEROSCOPY;  Surgeon: Zelphia Cairo, MD;  Location: [facility];  Service: Gynecology;  Laterality: N/A;  . Colonoscopy  06/24/2012    Procedure: COLONOSCOPY;  Surgeon: Malissa Hippo, MD;  Location: [facility];  Service: Endoscopy;  Laterality: N/A;  1:20  . Ventral hernia repair  07/07/2012    Procedure: LAPAROSCOPIC VENTRAL HERNIA;  Surgeon: Kandis Cocking, MD;  Location: [facility];  Service: General;  Laterality: Left;   Laparoscopic Repair of Left Spigelian hernia  . Hernia repair    . Cholecystectomy    . Cholecystectomy N/A 08/06/2013    Procedure: LAPAROSCOPIC CHOLECYSTECTOMY WITH UMBILICAL HERNIA REPAIR;  Surgeon: Shelly Rubenstein, MD;  Location: [facility];  Service: General;  Laterality: N/A;   History reviewed. No pertinent family history. History  Substance Use Topics  . Smoking status: Never Smoker   . Smokeless tobacco: Not on file  . Alcohol Use: No   OB History   Grav Para Term Preterm Abortions TAB SAB Ect Mult Living                 Review of Systems  Constitutional: Negative for activity change and appetite change.  Eyes: Negative for pain.  Respiratory: Positive for shortness of breath. Negative for chest tightness.   Cardiovascular: Positive for chest pain. Negative for leg swelling.  Gastrointestinal: Negative for nausea, vomiting, abdominal pain and diarrhea.  Genitourinary: Negative for flank pain.  Musculoskeletal: Negative for back pain and neck stiffness.  Skin: Negative for rash.  Neurological: Negative for weakness, numbness and headaches.  Psychiatric/Behavioral: Negative for behavioral problems.    Allergies  Codeine  Home Medications   Current Outpatient Rx  Name  Route  Sig  Dispense  Refill  . COMBIPATCH 0.05-0.25 MG/DAY   Transdermal   Place 1 patch onto the skin 2 (two) times a week. Tuesday and Saturday         . omeprazole (PRILOSEC) 20 MG capsule   Oral  Take 1 capsule (20 mg total) by mouth daily.   14 capsule   0   . traMADol (ULTRAM) 50 MG tablet   Oral   Take 1 tablet (50 mg total) by mouth every 6 (six) hours as needed.   15 tablet   0    BP 112/89  Pulse 87  Temp(Src) 97.8 F (36.6 C)  Resp 16  SpO2 98% Physical Exam  Nursing note and vitals reviewed. Constitutional: She is oriented to person, place, and time. She appears well-developed and well-nourished.  HENT:  Head: Normocephalic and atraumatic.  Eyes: EOM are normal.  Pupils are equal, round, and reactive to light.  Neck: Normal range of motion. Neck supple.  Cardiovascular: Regular rhythm and normal heart sounds.   No murmur heard. Tachycardia  Pulmonary/Chest: Effort normal and breath sounds normal. No respiratory distress. She has no wheezes. She has no rales.  Abdominal: Soft. Bowel sounds are normal. She exhibits no distension. There is tenderness. There is no rebound and no guarding.  Mild upper abdominal tenderness. Surgical sites healing well  Musculoskeletal: Normal range of motion.  Neurological: She is alert and oriented to person, place, and time. No cranial nerve deficit.  Skin: Skin is warm and dry.  Psychiatric: She has a normal mood and affect. Her speech is normal.    ED Course  Procedures (including critical care time) Labs Review Labs Reviewed  CBC - Abnormal; Notable for the following:    WBC 15.1 (*)    All other components within normal limits  BASIC METABOLIC PANEL  HEPATIC FUNCTION PANEL  LIPASE, BLOOD  POCT I-STAT TROPONIN I   Imaging Review Dg Chest 2 View  08/08/2013   CLINICAL DATA:  Chest pain, shortness of breath  EXAM: CHEST  2 VIEW  COMPARISON:  08/05/2013  FINDINGS: Streaky left base atelectasis. Normal heart size and vascularity. No CHF, pneumonia, collapse or consolidation. No effusion or pneumothorax. Trachea midline. Prior cholecystectomy noted.  IMPRESSION: Left base streaky atelectasis. No other acute finding.   Electronically Signed   By: Ruel Favors M.D.   On: 08/08/2013 16:28   Ct Angio Chest W/cm &/or Wo Cm  08/08/2013   CLINICAL DATA:  Chest pain.  EXAM: CT ANGIOGRAPHY CHEST WITH CONTRAST  TECHNIQUE: Multidetector CT imaging of the chest was performed using the standard protocol during bolus administration of intravenous contrast. Multiplanar CT image reconstructions including MIPs were obtained to evaluate the vascular anatomy.  CONTRAST:  80mL OMNIPAQUE IOHEXOL 350 MG/ML SOLN  COMPARISON:  Chest  x-ray earlier jaw pain.  FINDINGS: No filling defects in the pulmonary arteries to suggest pulmonary emboli. Heart is normal size. Aorta is normal caliber. No mediastinal, hilar, or axillary adenopathy. Chest wall soft tissues are unremarkable.  Small hiatal hernia. Linear atelectasis in the lung bases bilaterally. No pleural effusions or suspicious pulmonary nodules. Imaging into the upper abdomen shows no acute findings.  Review of the MIP images confirms the above findings.  IMPRESSION: No evidence of pulmonary embolus.  Bibasilar atelectasis.  Small hiatal hernia.   Electronically Signed   By: Charlett Nose M.D.   On: 08/08/2013 19:38    EKG Interpretation     Ventricular Rate:  106 PR Interval:  150 QRS Duration: 68 QT Interval:  304 QTC Calculation: 403 R Axis:   81 Text Interpretation:  Sinus tachycardia Low voltage QRS Nonspecific T wave abnormality Abnormal ECG            MDM   1. Chest  pain    Patient presents with chest pain her lower chest. Recent surgery. EKG is overall reassuring. Mild tachycardia. CT angiography of the chest was done due to recent surgery. No pulmonary embolisms. There was a hiatal hernia but could cause some of pain. Will discharge home and will followup as needed.    Juliet Rude. Rubin Payor, MD 08/08/13 2107

## 2013-08-08 NOTE — ED Notes (Signed)
Pt in CT angiogram

## 2013-08-08 NOTE — ED Notes (Signed)
Patient transported to CT 

## 2013-08-08 NOTE — ED Notes (Signed)
Pt just returned from xray to room.

## 2013-08-08 NOTE — ED Notes (Signed)
Pt IV flushed to ensure patency for CT Angio

## 2013-08-08 NOTE — ED Notes (Signed)
CT called and notified pt was ready for transport.

## 2013-08-23 ENCOUNTER — Ambulatory Visit (INDEPENDENT_AMBULATORY_CARE_PROVIDER_SITE_OTHER): Payer: Commercial Indemnity | Admitting: Surgery

## 2013-08-23 ENCOUNTER — Encounter (INDEPENDENT_AMBULATORY_CARE_PROVIDER_SITE_OTHER): Payer: Self-pay | Admitting: Surgery

## 2013-08-23 VITALS — BP 130/76 | HR 74 | Temp 98.1°F | Resp 14 | Ht 66.0 in | Wt 195.6 lb

## 2013-08-23 DIAGNOSIS — Z09 Encounter for follow-up examination after completed treatment for conditions other than malignant neoplasm: Secondary | ICD-10-CM

## 2013-08-23 NOTE — Progress Notes (Signed)
Subjective:     Patient ID: Marisa Mitchell, female   DOB: 10/03/1960, 52 y.o.   MRN: [id number]  HPI She is here for her first postop visit status post laparoscopic cholecystectomy. She is doing well  Review of Systems     Objective:   Physical Exam On exam, her incisions are well healed. The final pathology showed chronic cholecystitis with a large gallstone    Assessment:     Patient stable postop     Plan:     She may resume normal activity. I will see her back as needed

## 2013-09-19 ENCOUNTER — Ambulatory Visit (INDEPENDENT_AMBULATORY_CARE_PROVIDER_SITE_OTHER): Payer: Commercial Indemnity | Admitting: Internal Medicine

## 2013-09-19 ENCOUNTER — Encounter (INDEPENDENT_AMBULATORY_CARE_PROVIDER_SITE_OTHER): Payer: Self-pay | Admitting: Internal Medicine

## 2013-09-19 VITALS — BP 118/70 | HR 72 | Ht 66.0 in | Wt 186.5 lb

## 2013-09-19 DIAGNOSIS — F411 Generalized anxiety disorder: Secondary | ICD-10-CM

## 2013-09-19 DIAGNOSIS — K219 Gastro-esophageal reflux disease without esophagitis: Secondary | ICD-10-CM

## 2013-09-19 NOTE — Patient Instructions (Signed)
Patient feels good. No symptoms at this time.  OV prn.

## 2013-09-19 NOTE — Progress Notes (Signed)
Subjective:     Patient ID: Marisa Mitchell Seen, female   DOB: [birth year], 52 y.o.   MRN: [id number]  HPI  In November, after her GB surgery,  she began to have indigestion and pain between her shoulder blades (chest pain). . She was seen at [facility] and  was started on Prilosec. Seen in the ED for her chest pain. Cardiac work up was negative. PE was ruled out on CT chest.  She also was evaluated by her GYN and placed on Lexapro for anxiety. Told her TSH was low and has been referred to an endocrinologist. She says she feels better. She actually thinks her symptoms were related to her recent GB surgery last month. Today she feels 100% better. No pain. No acid reflux.  Appetite is good.     06/24/2012 Colonoscopy: Impression:  Normal colonoscopy except small external hemorrhoids.  Adherent piece of stool and appendiceal stump dislodged with biopsy foreceps.  08/06/2013 Cholecystectomy:  Findings:  Cholecystitis with Cholelithiasis  Review of Systems Current Outpatient Prescriptions  Medication Sig Dispense Refill  . COMBIPATCH 0.05-0.25 MG/DAY Place 1 patch onto the skin 2 (two) times a week. Tuesday and Saturday      . omeprazole (PRILOSEC) 20 MG capsule Take 1 capsule (20 mg total) by mouth daily.  14 capsule  0  . traMADol (ULTRAM) 50 MG tablet Take 1 tablet (50 mg total) by mouth every 6 (six) hours as needed.  15 tablet  0   No current facility-administered medications for this visit.   Past Medical History  Diagnosis Date  . Headache(784.0)   . GERD (gastroesophageal reflux disease)    Past Surgical History  Procedure Laterality Date  . Cesarean section      x2  . Appendectomy    . Cervical conization w/bx  06/03/2012    Procedure: CONIZATION CERVIX WITH BIOPSY;  Surgeon: Zelphia Cairo, MD;  Location: [facility];  Service: Gynecology;  Laterality: N/A;  . Hysteroscopy w/d&c  06/03/2012    Procedure: DILATATION AND CURETTAGE /HYSTEROSCOPY;  Surgeon: Zelphia Cairo, MD;   Location: [facility];  Service: Gynecology;  Laterality: N/A;  . Colonoscopy  06/24/2012    Procedure: COLONOSCOPY;  Surgeon: Malissa Hippo, MD;  Location: [facility];  Service: Endoscopy;  Laterality: N/A;  1:20  . Ventral hernia repair  07/07/2012    Procedure: LAPAROSCOPIC VENTRAL HERNIA;  Surgeon: Kandis Cocking, MD;  Location: [facility];  Service: General;  Laterality: Left;  Laparoscopic Repair of Left Spigelian hernia  . Hernia repair    . Cholecystectomy    . Cholecystectomy N/A 08/06/2013    Procedure: LAPAROSCOPIC CHOLECYSTECTOMY WITH UMBILICAL HERNIA REPAIR;  Surgeon: Shelly Rubenstein, MD;  Location: [facility];  Service: General;  Laterality: N/A;   Allergies  Allergen Reactions  . Codeine Other (See Comments)    Stomach Cramps        Objective:   Physical Exam   Filed Vitals:   09/19/13 1027  Height: 5\' 6"  (1.676 m)  Weight: 186 lb 8 oz (84.596 kg)   Alert and oriented. Skin warm and dry. Oral mucosa is moist.   . Sclera anicteric, conjunctivae is pink. Thyroid not enlarged. No cervical lymphadenopathy. Lungs clear. Heart regular rate and rhythm.  Abdomen is soft. Bowel sounds are positive. No hepatomegaly. No abdominal masses felt. No tenderness.  No edema to lower extremities.       Assessment:    Possible GERD. Anxiety. Recent work up in the ED negative  for PE from recent surgery. Patient is asymtomatic now.     Plan:    OV prn. If any problems, please call our office.

## 2013-10-20 ENCOUNTER — Other Ambulatory Visit (HOSPITAL_COMMUNITY): Payer: Self-pay | Admitting: "Endocrinology

## 2013-10-20 DIAGNOSIS — E049 Nontoxic goiter, unspecified: Secondary | ICD-10-CM

## 2013-11-02 ENCOUNTER — Other Ambulatory Visit: Payer: Self-pay | Admitting: Obstetrics and Gynecology

## 2013-12-12 ENCOUNTER — Ambulatory Visit (HOSPITAL_COMMUNITY)
Admission: RE | Admit: 2013-12-12 | Discharge: 2013-12-12 | Disposition: A | Discharge status: Home / Self Care | Payer: Managed Care, Other (non HMO) | Source: Ambulatory Visit | Attending: "Endocrinology | Admitting: "Endocrinology

## 2013-12-12 DIAGNOSIS — E042 Nontoxic multinodular goiter: Secondary | ICD-10-CM | POA: Insufficient documentation

## 2013-12-12 DIAGNOSIS — E049 Nontoxic goiter, unspecified: Secondary | ICD-10-CM

## 2014-01-10 ENCOUNTER — Encounter (INDEPENDENT_AMBULATORY_CARE_PROVIDER_SITE_OTHER): Payer: Self-pay

## 2014-04-29 HISTORY — PX: BREAST BIOPSY: SHX20

## 2014-05-09 ENCOUNTER — Other Ambulatory Visit: Payer: Self-pay | Admitting: Obstetrics and Gynecology

## 2014-05-10 LAB — CYTOLOGY - PAP

## 2014-05-15 ENCOUNTER — Other Ambulatory Visit: Payer: Self-pay | Admitting: Obstetrics and Gynecology

## 2014-05-15 DIAGNOSIS — R928 Other abnormal and inconclusive findings on diagnostic imaging of breast: Secondary | ICD-10-CM

## 2014-05-19 ENCOUNTER — Other Ambulatory Visit: Payer: Self-pay | Admitting: Obstetrics and Gynecology

## 2014-05-19 ENCOUNTER — Ambulatory Visit
Admission: RE | Admit: 2014-05-19 | Discharge: 2014-05-19 | Disposition: A | Discharge status: Home / Self Care | Payer: Managed Care, Other (non HMO) | Source: Ambulatory Visit | Attending: Obstetrics and Gynecology | Admitting: Obstetrics and Gynecology

## 2014-05-19 DIAGNOSIS — R928 Other abnormal and inconclusive findings on diagnostic imaging of breast: Secondary | ICD-10-CM

## 2014-05-26 ENCOUNTER — Ambulatory Visit
Admission: RE | Admit: 2014-05-26 | Discharge: 2014-05-26 | Disposition: A | Discharge status: Home / Self Care | Payer: Managed Care, Other (non HMO) | Source: Ambulatory Visit | Attending: Obstetrics and Gynecology | Admitting: Obstetrics and Gynecology

## 2014-05-26 DIAGNOSIS — R928 Other abnormal and inconclusive findings on diagnostic imaging of breast: Secondary | ICD-10-CM

## 2014-11-28 ENCOUNTER — Other Ambulatory Visit: Payer: Self-pay | Admitting: Obstetrics and Gynecology

## 2014-11-29 LAB — CYTOLOGY - PAP

## 2014-12-03 IMAGING — CR DG CHEST 2V
2 series · 2 of 2 positions shown · non-contrast
Comparison: None.

CLINICAL DATA: Chest pain.

EXAM:
CHEST  2 VIEW

[w chest pa]
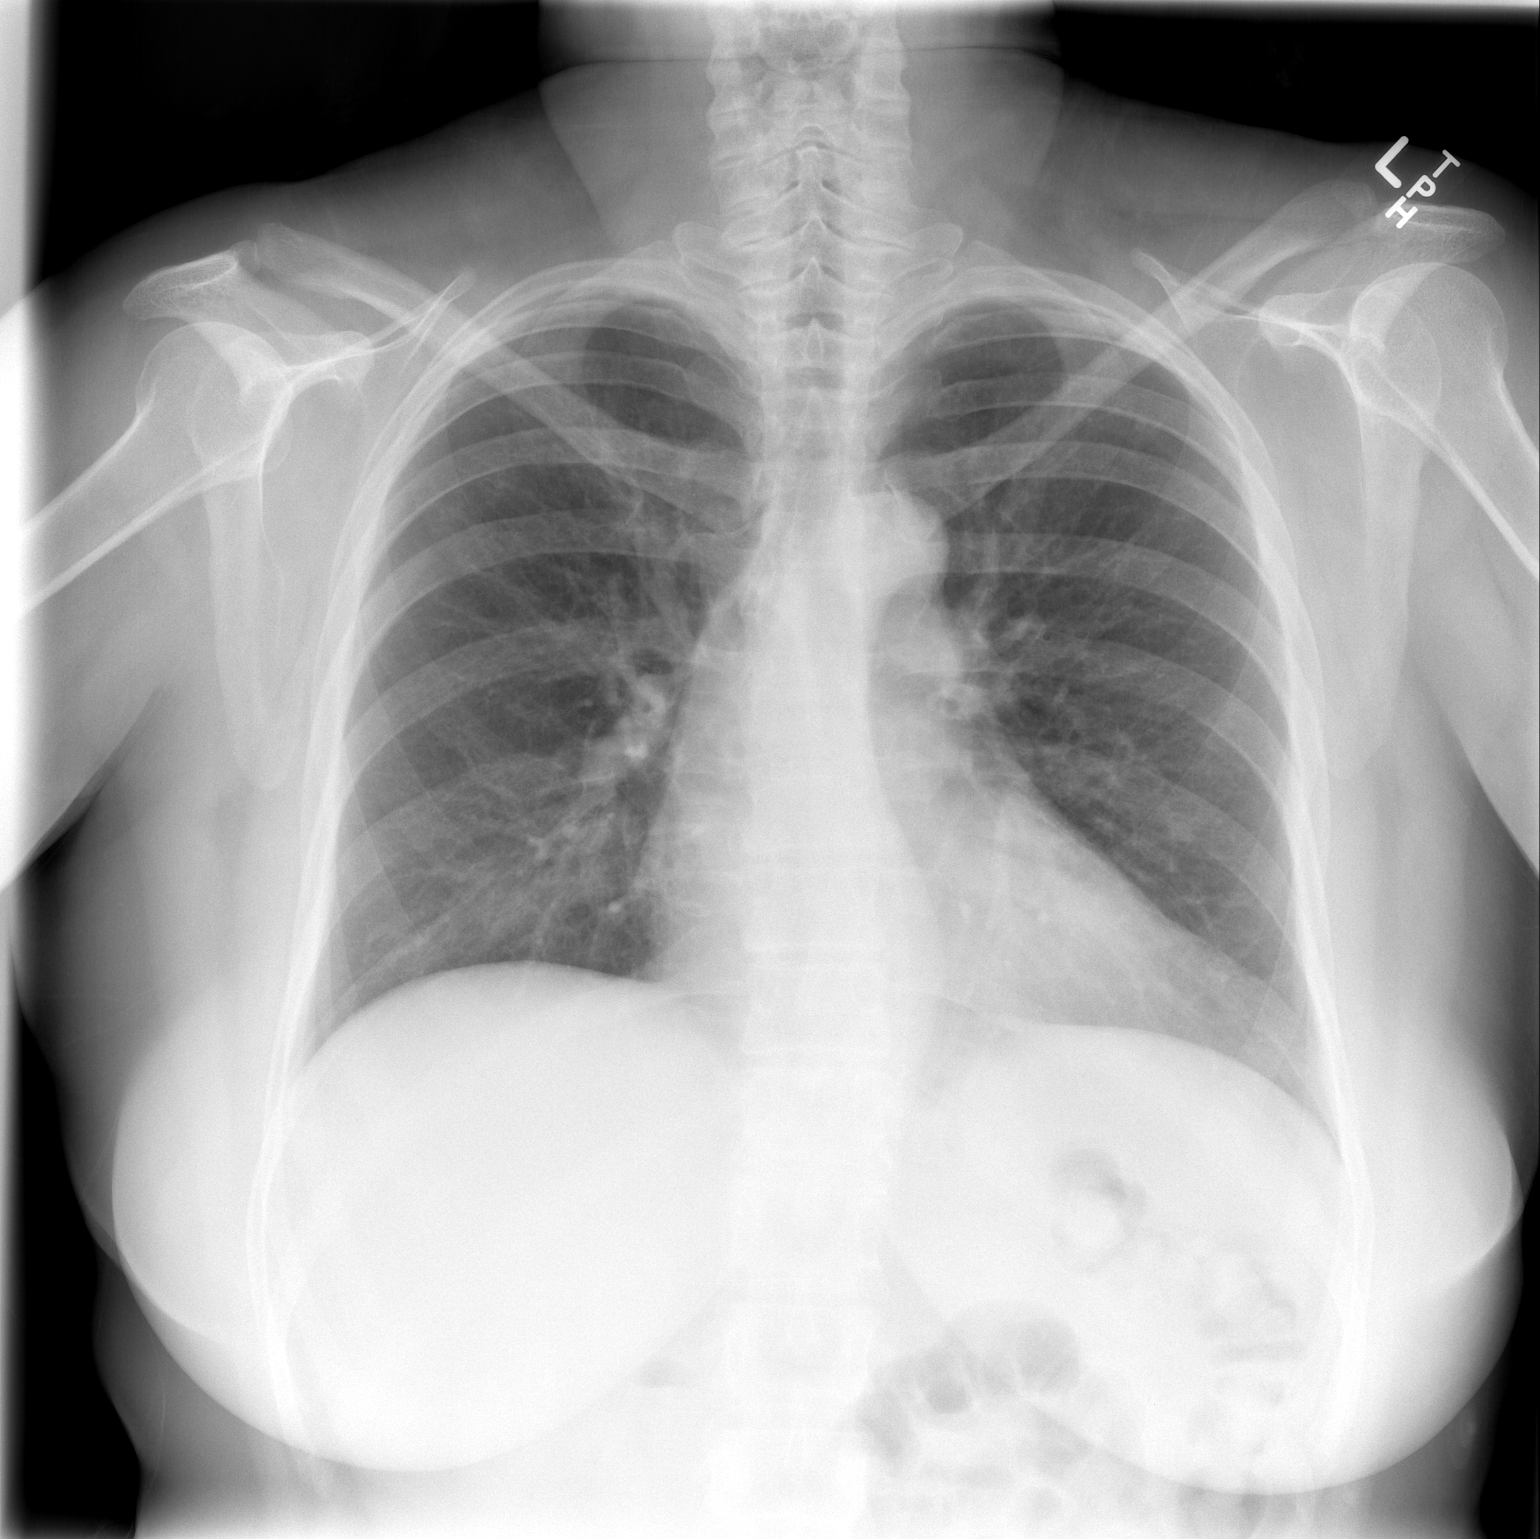

[w chest lat]
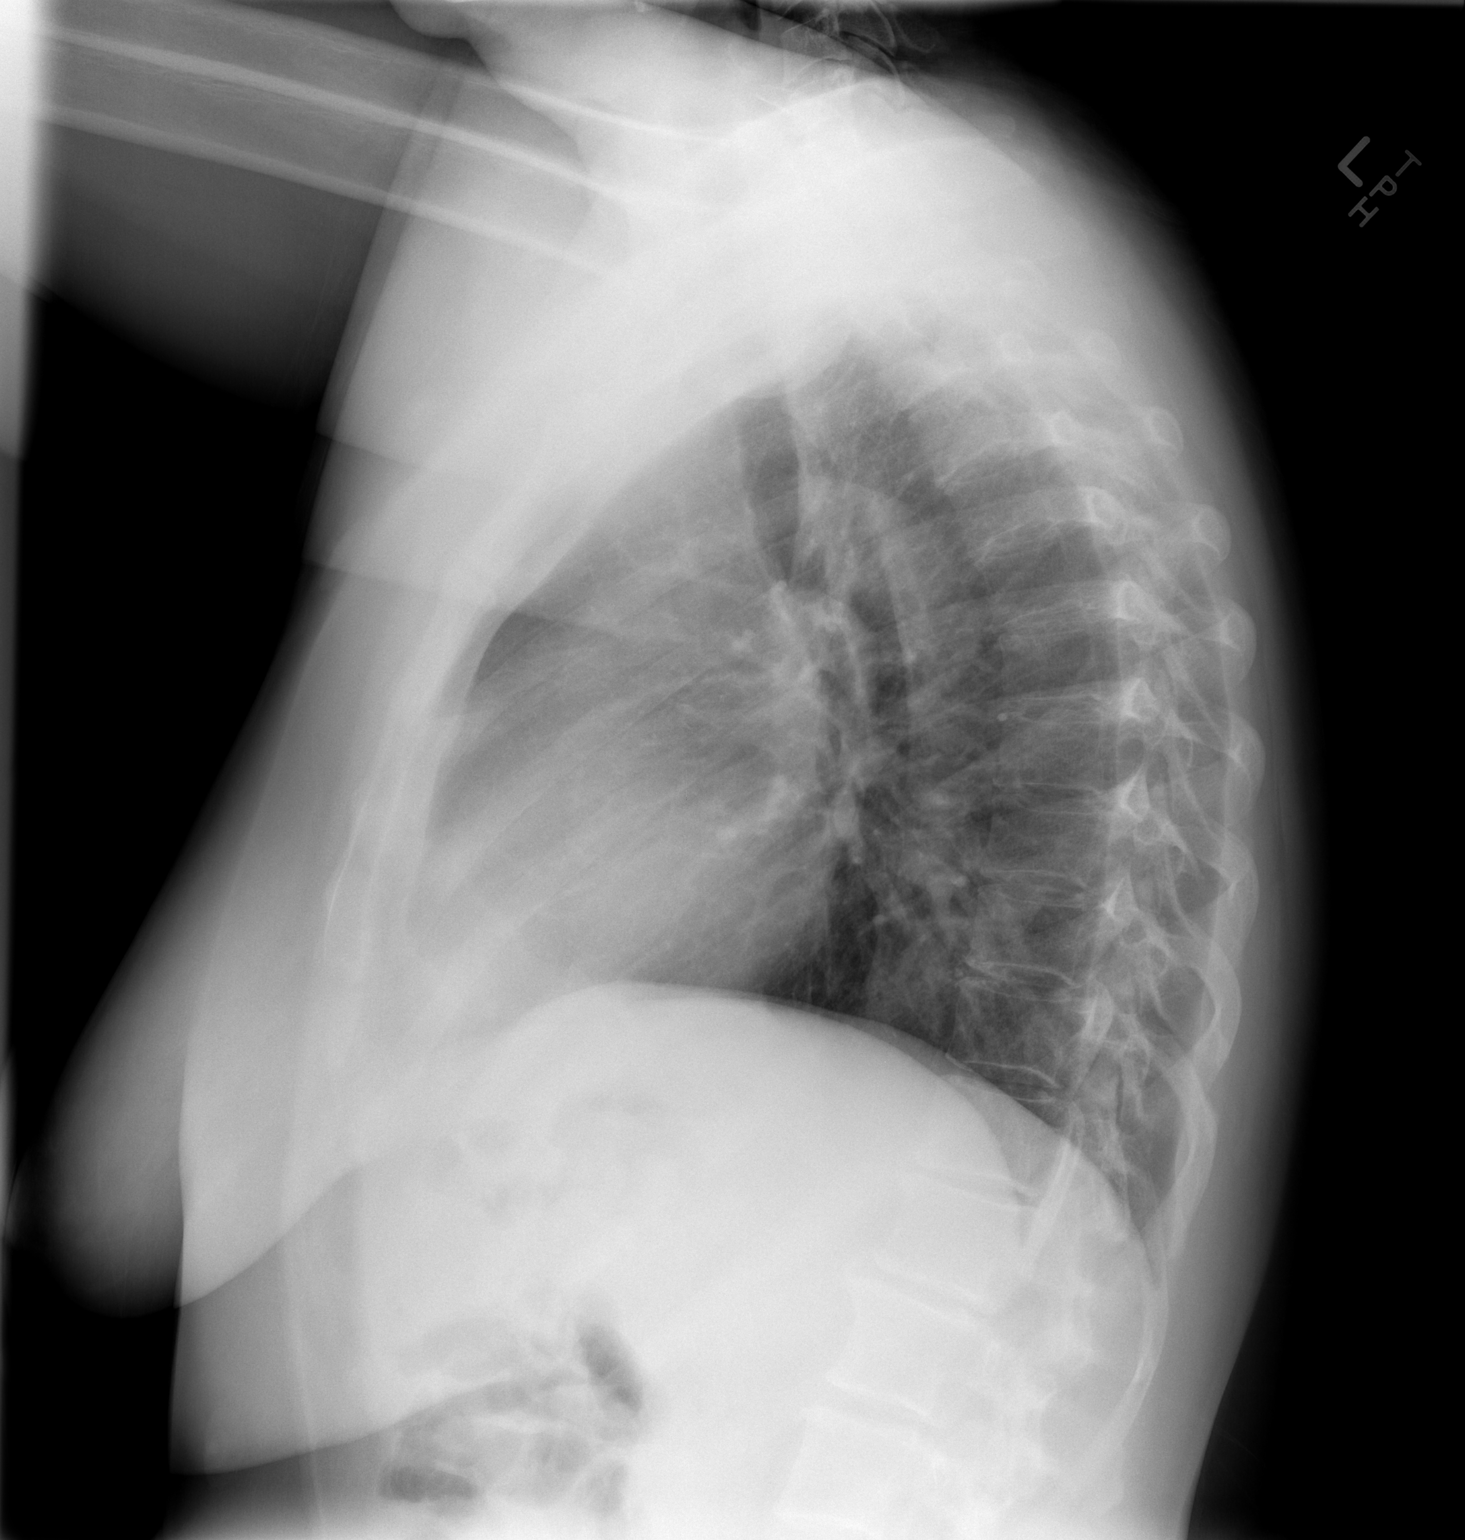

[2 of 2 positions shown; findings below may reference images not displayed]

FINDINGS: The heart size and mediastinal contours are within normal limits.
Both lungs are clear. The visualized skeletal structures are
unremarkable.
IMPRESSION: No active cardiopulmonary disease.

## 2014-12-06 IMAGING — CT CT ANGIO CHEST
2 of 9 series · 18 of 46 positions shown · IV contrast (omnipaque)
Comparison: Chest x-ray earlier jaw pain.

CLINICAL DATA: Chest pain.

EXAM:
CT ANGIOGRAPHY CHEST WITH CONTRAST
TECHNIQUE: Multidetector CT imaging of the chest was performed using the
standard protocol during bolus administration of intravenous
contrast. Multiplanar CT image reconstructions including MIPs were
obtained to evaluate the vascular anatomy.
CONTRAST:  80mL OMNIPAQUE IOHEXOL 350 MG/ML SOLN

[Series 5: thins · axial · 0.61mm/px · z∈[-205,-1]mm · 15 of 232 slices shown]
[im 14/232  lung]
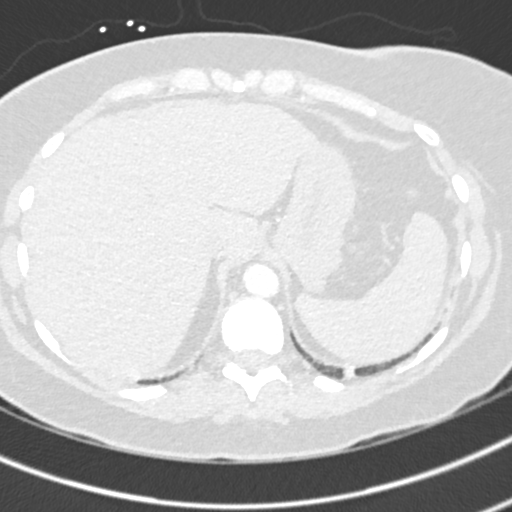
[im 28/232  soft-tissue]
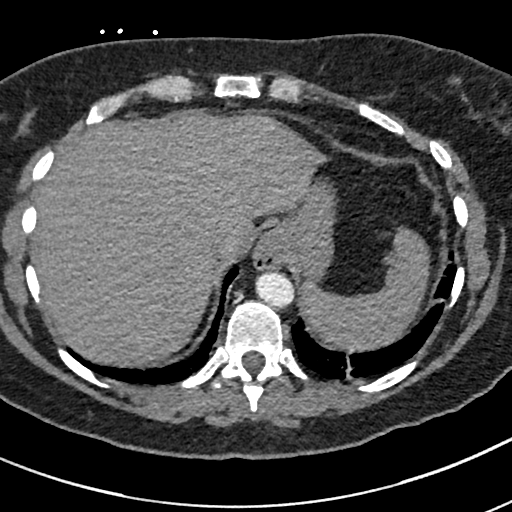
[im 41/232  lung]
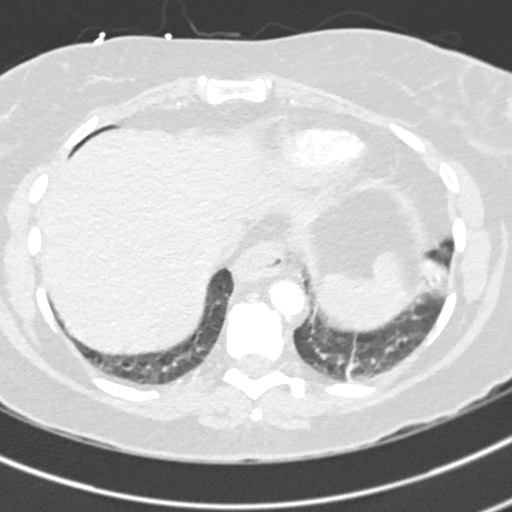
[im 55/232  soft-tissue]
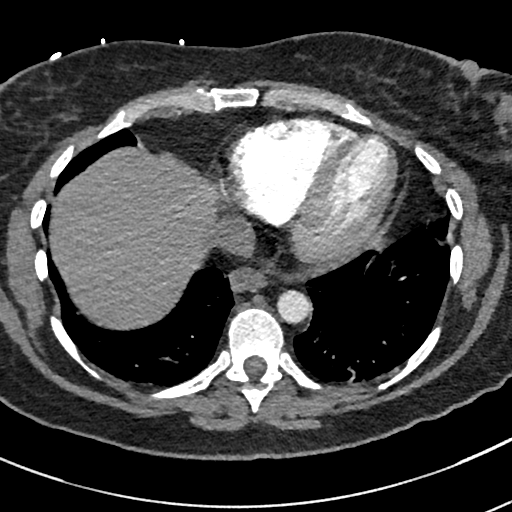
[im 68/232  lung]
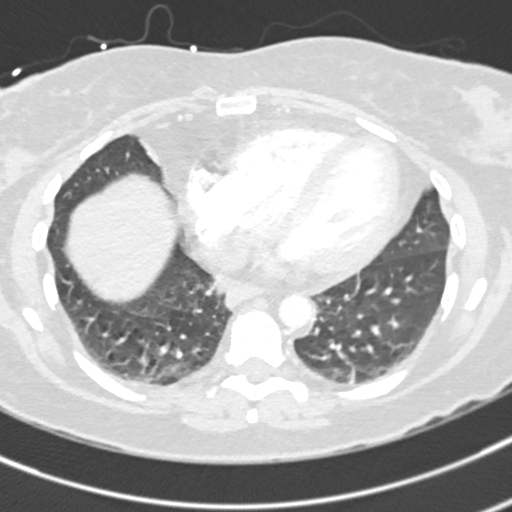
[im 82/232  soft-tissue]
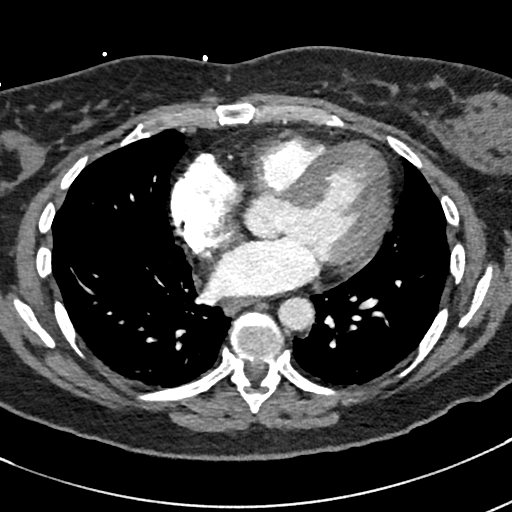
[im 96/232  lung]
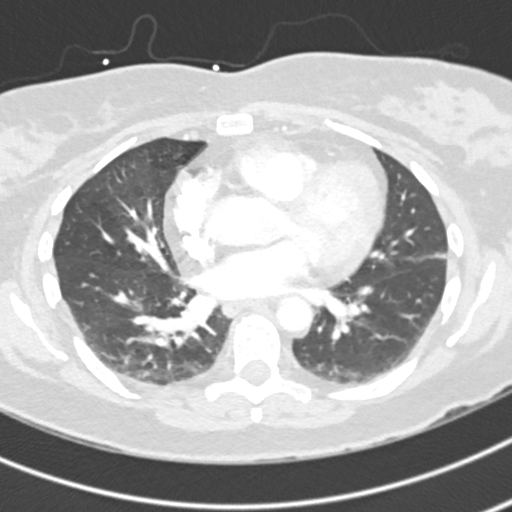
[im 123/232  soft-tissue]
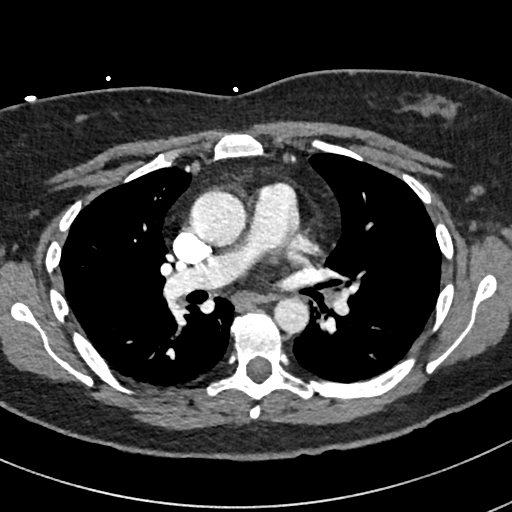
[im 136/232  lung]
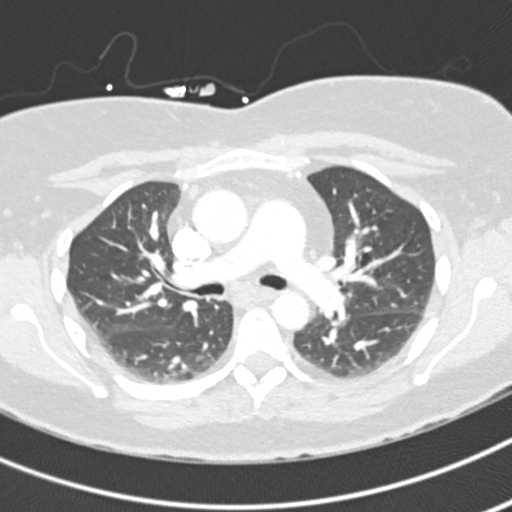
[im 150/232  soft-tissue]
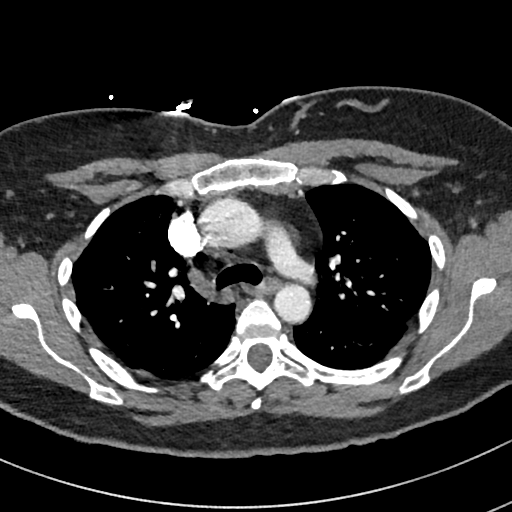
[im 164/232  lung]
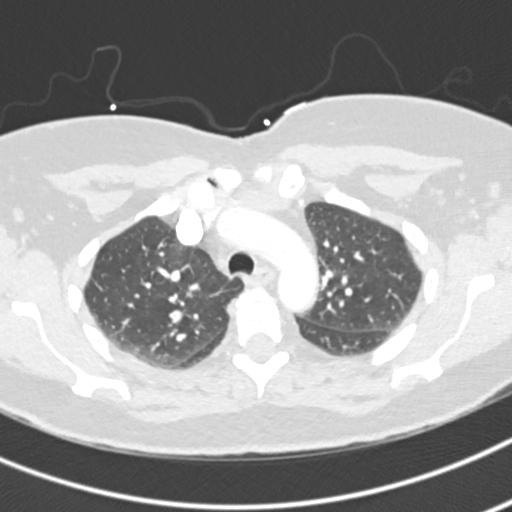
[im 177/232  soft-tissue]
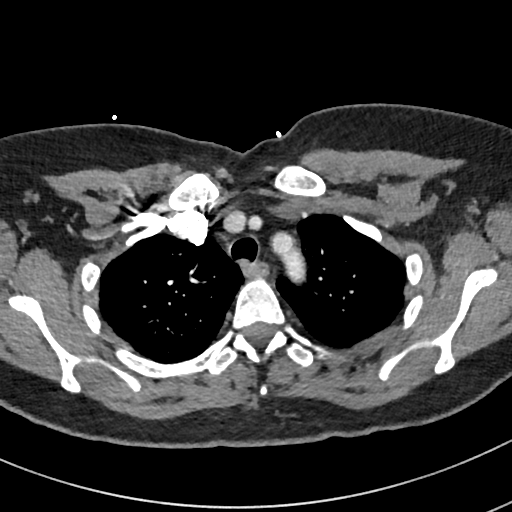
[im 191/232  lung]
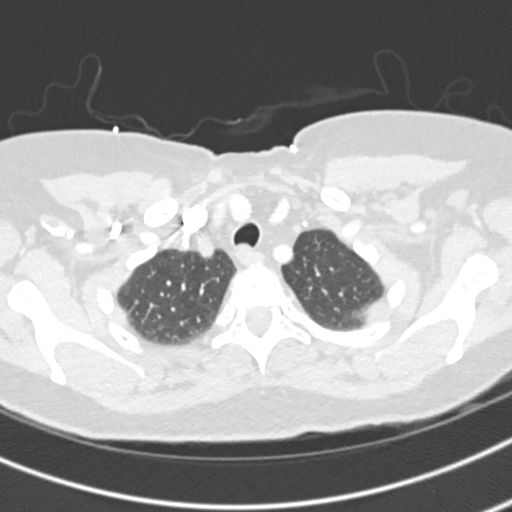
[im 204/232  soft-tissue]
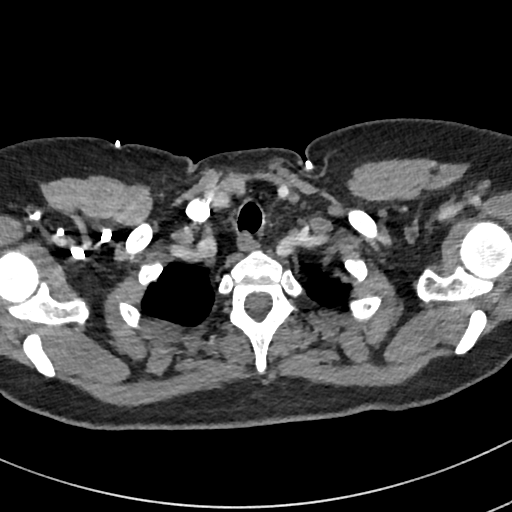
[im 218/232  lung]
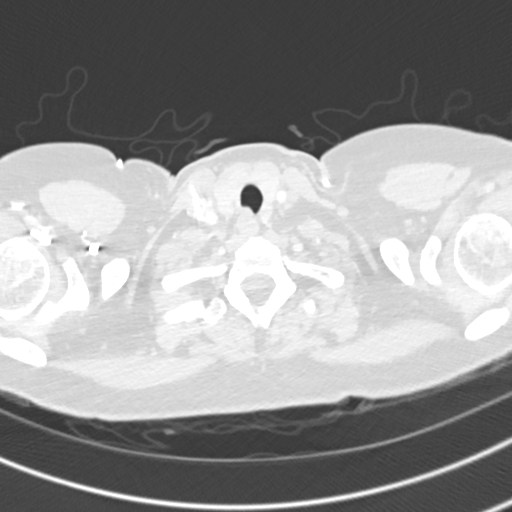

[Series 7: coronal mpr · coronal · 0.48mm/px · 3 of 110 slices shown]
[im 28/110  soft-tissue]
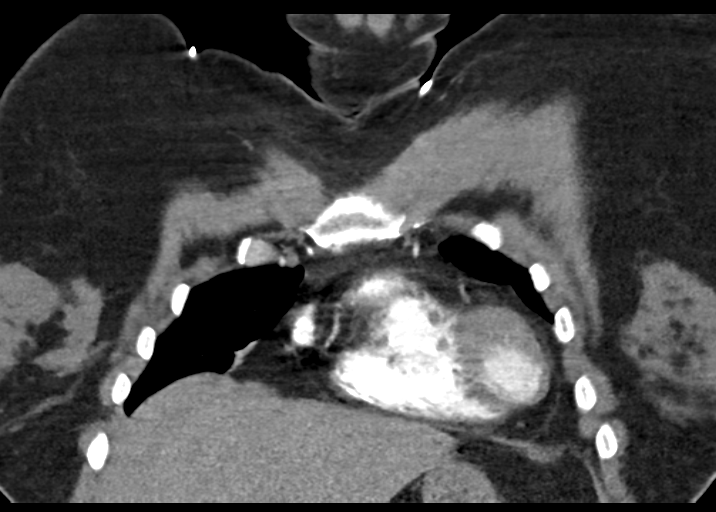
[im 55/110  soft-tissue]
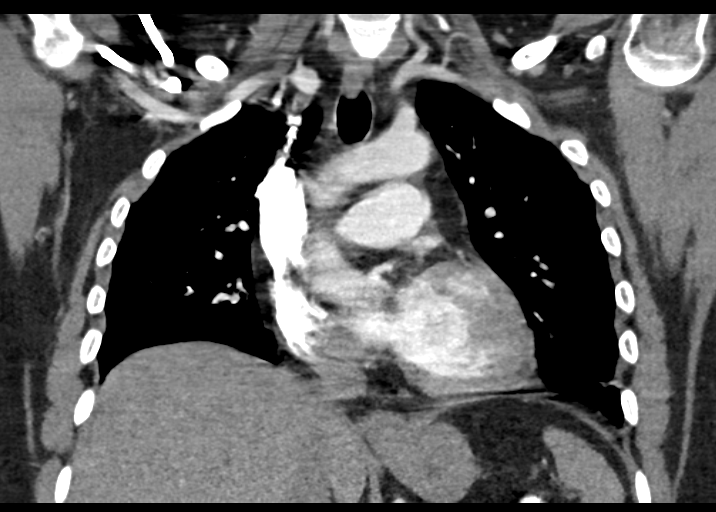
[im 82/110  soft-tissue]
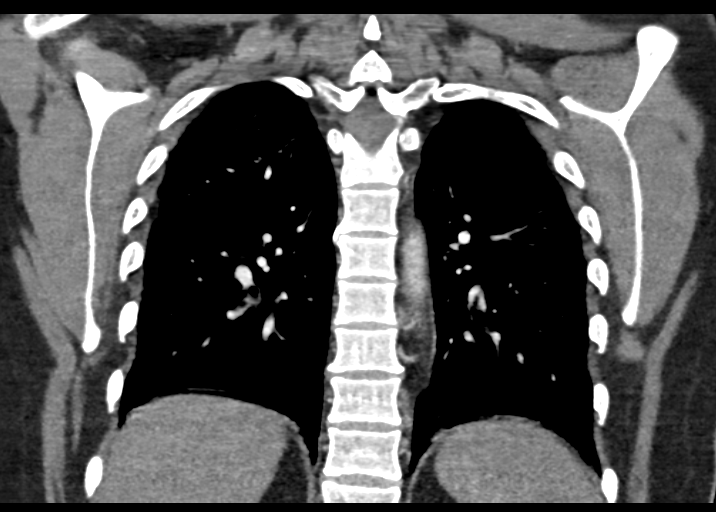

[18 of 46 positions shown; findings below may reference images not displayed]

FINDINGS: No filling defects in the pulmonary arteries to suggest pulmonary
emboli. Heart is normal size. Aorta is normal caliber. No
mediastinal, hilar, or axillary adenopathy. Chest wall soft tissues
are unremarkable.

Small hiatal hernia. Linear atelectasis in the lung bases
bilaterally. No pleural effusions or suspicious pulmonary nodules.
Imaging into the upper abdomen shows no acute findings.

Review of the MIP images confirms the above findings.
IMPRESSION: No evidence of pulmonary embolus.

Bibasilar atelectasis.

Small hiatal hernia.

## 2015-01-26 ENCOUNTER — Other Ambulatory Visit (HOSPITAL_COMMUNITY): Payer: Self-pay | Admitting: Family Medicine

## 2015-01-26 DIAGNOSIS — E049 Nontoxic goiter, unspecified: Secondary | ICD-10-CM

## 2015-01-31 ENCOUNTER — Ambulatory Visit (HOSPITAL_COMMUNITY)
Admission: RE | Admit: 2015-01-31 | Discharge: 2015-01-31 | Disposition: A | Discharge status: Home / Self Care | Payer: Managed Care, Other (non HMO) | Source: Ambulatory Visit | Attending: Family Medicine | Admitting: Family Medicine

## 2015-01-31 DIAGNOSIS — E049 Nontoxic goiter, unspecified: Secondary | ICD-10-CM | POA: Insufficient documentation

## 2016-11-05 ENCOUNTER — Other Ambulatory Visit: Payer: Self-pay | Admitting: Obstetrics and Gynecology

## 2016-11-05 DIAGNOSIS — N6322 Unspecified lump in the left breast, upper inner quadrant: Secondary | ICD-10-CM

## 2016-11-11 ENCOUNTER — Ambulatory Visit
Admission: RE | Admit: 2016-11-11 | Discharge: 2016-11-11 | Disposition: A | Discharge status: Home / Self Care | Payer: BLUE CROSS/BLUE SHIELD | Source: Ambulatory Visit | Attending: Obstetrics and Gynecology | Admitting: Obstetrics and Gynecology

## 2016-11-11 DIAGNOSIS — N6322 Unspecified lump in the left breast, upper inner quadrant: Secondary | ICD-10-CM

## 2017-04-03 ENCOUNTER — Other Ambulatory Visit: Payer: Self-pay | Admitting: Obstetrics and Gynecology

## 2017-04-03 DIAGNOSIS — N6320 Unspecified lump in the left breast, unspecified quadrant: Secondary | ICD-10-CM

## 2017-05-14 ENCOUNTER — Other Ambulatory Visit: Payer: BLUE CROSS/BLUE SHIELD

## 2017-05-21 ENCOUNTER — Ambulatory Visit
Admission: RE | Admit: 2017-05-21 | Discharge: 2017-05-21 | Disposition: A | Discharge status: Home / Self Care | Payer: BLUE CROSS/BLUE SHIELD | Source: Ambulatory Visit | Attending: Obstetrics and Gynecology | Admitting: Obstetrics and Gynecology

## 2017-05-21 ENCOUNTER — Other Ambulatory Visit: Payer: Self-pay | Admitting: Obstetrics and Gynecology

## 2017-05-21 DIAGNOSIS — N6320 Unspecified lump in the left breast, unspecified quadrant: Secondary | ICD-10-CM

## 2017-11-16 ENCOUNTER — Other Ambulatory Visit: Payer: BLUE CROSS/BLUE SHIELD

## 2018-01-18 ENCOUNTER — Other Ambulatory Visit: Payer: BLUE CROSS/BLUE SHIELD

## 2018-02-03 ENCOUNTER — Ambulatory Visit
Admission: RE | Admit: 2018-02-03 | Discharge: 2018-02-03 | Disposition: A | Discharge status: Home / Self Care | Payer: No Typology Code available for payment source | Source: Ambulatory Visit | Attending: Obstetrics and Gynecology | Admitting: Obstetrics and Gynecology

## 2018-02-03 ENCOUNTER — Other Ambulatory Visit: Payer: Self-pay | Admitting: Obstetrics and Gynecology

## 2018-02-03 DIAGNOSIS — R928 Other abnormal and inconclusive findings on diagnostic imaging of breast: Secondary | ICD-10-CM

## 2018-02-03 DIAGNOSIS — N6320 Unspecified lump in the left breast, unspecified quadrant: Secondary | ICD-10-CM

## 2019-02-23 ENCOUNTER — Other Ambulatory Visit: Payer: Self-pay | Admitting: Obstetrics and Gynecology

## 2019-02-23 DIAGNOSIS — N6320 Unspecified lump in the left breast, unspecified quadrant: Secondary | ICD-10-CM

## 2019-03-15 ENCOUNTER — Ambulatory Visit
Admission: RE | Admit: 2019-03-15 | Discharge: 2019-03-15 | Disposition: A | Discharge status: Home / Self Care | Payer: Self-pay | Source: Ambulatory Visit | Attending: Obstetrics and Gynecology | Admitting: Obstetrics and Gynecology

## 2019-03-15 ENCOUNTER — Ambulatory Visit
Admission: RE | Admit: 2019-03-15 | Discharge: 2019-03-15 | Disposition: A | Discharge status: Home / Self Care | Payer: 59 | Source: Ambulatory Visit | Attending: Obstetrics and Gynecology | Admitting: Obstetrics and Gynecology

## 2019-03-15 ENCOUNTER — Other Ambulatory Visit: Payer: Self-pay

## 2019-03-15 DIAGNOSIS — N6320 Unspecified lump in the left breast, unspecified quadrant: Secondary | ICD-10-CM

## 2022-05-23 ENCOUNTER — Encounter (INDEPENDENT_AMBULATORY_CARE_PROVIDER_SITE_OTHER): Payer: Self-pay | Admitting: *Deleted

## 2024-03-10 ENCOUNTER — Other Ambulatory Visit: Payer: Self-pay | Admitting: Obstetrics and Gynecology

## 2024-03-10 DIAGNOSIS — R928 Other abnormal and inconclusive findings on diagnostic imaging of breast: Secondary | ICD-10-CM

## 2024-03-14 ENCOUNTER — Other Ambulatory Visit: Payer: Self-pay | Admitting: Obstetrics and Gynecology

## 2024-03-14 ENCOUNTER — Ambulatory Visit
Admission: RE | Admit: 2024-03-14 | Discharge: 2024-03-14 | Disposition: A | Discharge status: Home / Self Care | Source: Ambulatory Visit | Attending: Obstetrics and Gynecology | Admitting: Obstetrics and Gynecology

## 2024-03-14 DIAGNOSIS — R921 Mammographic calcification found on diagnostic imaging of breast: Secondary | ICD-10-CM

## 2024-03-14 DIAGNOSIS — R928 Other abnormal and inconclusive findings on diagnostic imaging of breast: Secondary | ICD-10-CM

## 2024-03-22 ENCOUNTER — Encounter

## 2024-09-15 ENCOUNTER — Encounter
